# Patient Record
Sex: Female | Born: 1985 | Race: White | Hispanic: Yes | Marital: Married | Smoking: Never smoker
Health system: Southern US, Community
[De-identification: ages and names within clinical notes are randomized; demographics above are authoritative.]

## PROBLEM LIST (undated history)

## (undated) ENCOUNTER — Ambulatory Visit (HOSPITAL_COMMUNITY): Admission: EM | Payer: Self-pay | Source: Home / Self Care

## (undated) ENCOUNTER — Inpatient Hospital Stay (HOSPITAL_COMMUNITY): Payer: Self-pay

## (undated) DIAGNOSIS — O139 Gestational [pregnancy-induced] hypertension without significant proteinuria, unspecified trimester: Secondary | ICD-10-CM

## (undated) DIAGNOSIS — Z789 Other specified health status: Secondary | ICD-10-CM

## (undated) HISTORY — DX: Gestational (pregnancy-induced) hypertension without significant proteinuria, unspecified trimester: O13.9

## (undated) HISTORY — PX: CHOLECYSTECTOMY: SHX55

---

## 2006-03-08 ENCOUNTER — Inpatient Hospital Stay (HOSPITAL_COMMUNITY): Admission: AD | Admit: 2006-03-08 | Discharge: 2006-03-08 | Payer: Self-pay | Admitting: Obstetrics

## 2006-03-09 ENCOUNTER — Inpatient Hospital Stay (HOSPITAL_COMMUNITY): Admission: AD | Admit: 2006-03-09 | Discharge: 2006-03-13 | Payer: Self-pay | Admitting: Obstetrics

## 2006-03-10 DIAGNOSIS — O139 Gestational [pregnancy-induced] hypertension without significant proteinuria, unspecified trimester: Secondary | ICD-10-CM

## 2007-10-28 ENCOUNTER — Ambulatory Visit (HOSPITAL_COMMUNITY): Admission: RE | Admit: 2007-10-28 | Discharge: 2007-10-28 | Payer: Self-pay | Admitting: Physician Assistant

## 2008-01-02 ENCOUNTER — Emergency Department (HOSPITAL_COMMUNITY): Admission: EM | Admit: 2008-01-02 | Discharge: 2008-01-03 | Payer: Self-pay | Admitting: Emergency Medicine

## 2009-05-11 ENCOUNTER — Inpatient Hospital Stay (HOSPITAL_COMMUNITY): Admission: AD | Admit: 2009-05-11 | Discharge: 2009-05-11 | Payer: Self-pay | Admitting: Family Medicine

## 2010-07-22 LAB — URINE CULTURE

## 2010-07-22 LAB — URINE MICROSCOPIC-ADD ON

## 2010-07-22 LAB — URINALYSIS, ROUTINE W REFLEX MICROSCOPIC
Nitrite: NEGATIVE
Protein, ur: NEGATIVE mg/dL
Urobilinogen, UA: 0.2 mg/dL (ref 0.0–1.0)
pH: 6.5 (ref 5.0–8.0)

## 2010-07-22 LAB — GC/CHLAMYDIA PROBE AMP, GENITAL: Chlamydia, DNA Probe: NEGATIVE

## 2010-07-22 LAB — WET PREP, GENITAL: Clue Cells Wet Prep HPF POC: NONE SEEN

## 2012-07-08 LAB — OB RESULTS CONSOLE ABO/RH

## 2012-12-08 LAB — OB RESULTS CONSOLE GC/CHLAMYDIA
Chlamydia: NEGATIVE
Gonorrhea: NEGATIVE

## 2012-12-08 LAB — OB RESULTS CONSOLE ANTIBODY SCREEN: ANTIBODY SCREEN: NEGATIVE

## 2012-12-08 LAB — OB RESULTS CONSOLE RPR: RPR: NONREACTIVE

## 2012-12-08 LAB — OB RESULTS CONSOLE HEPATITIS B SURFACE ANTIGEN: HEP B S AG: NEGATIVE

## 2012-12-08 LAB — OB RESULTS CONSOLE HIV ANTIBODY (ROUTINE TESTING): HIV: NONREACTIVE

## 2012-12-08 LAB — OB RESULTS CONSOLE ABO/RH: RH Type: POSITIVE

## 2012-12-08 LAB — OB RESULTS CONSOLE RUBELLA ANTIBODY, IGM: Rubella: IMMUNE

## 2013-04-10 ENCOUNTER — Inpatient Hospital Stay (HOSPITAL_COMMUNITY): Payer: Medicaid Other

## 2013-04-10 ENCOUNTER — Inpatient Hospital Stay (HOSPITAL_COMMUNITY)
Admission: AD | Admit: 2013-04-10 | Discharge: 2013-04-10 | Disposition: A | Discharge status: Home / Self Care | Payer: Self-pay | Source: Ambulatory Visit | Attending: Obstetrics | Admitting: Obstetrics

## 2013-04-10 ENCOUNTER — Encounter (HOSPITAL_COMMUNITY): Payer: Self-pay

## 2013-04-10 ENCOUNTER — Inpatient Hospital Stay (HOSPITAL_COMMUNITY): Payer: Self-pay

## 2013-04-10 DIAGNOSIS — J9819 Other pulmonary collapse: Secondary | ICD-10-CM | POA: Insufficient documentation

## 2013-04-10 DIAGNOSIS — R0989 Other specified symptoms and signs involving the circulatory and respiratory systems: Secondary | ICD-10-CM | POA: Insufficient documentation

## 2013-04-10 DIAGNOSIS — R0609 Other forms of dyspnea: Secondary | ICD-10-CM | POA: Insufficient documentation

## 2013-04-10 DIAGNOSIS — O99891 Other specified diseases and conditions complicating pregnancy: Secondary | ICD-10-CM | POA: Insufficient documentation

## 2013-04-10 DIAGNOSIS — R079 Chest pain, unspecified: Secondary | ICD-10-CM | POA: Insufficient documentation

## 2013-04-10 DIAGNOSIS — O368190 Decreased fetal movements, unspecified trimester, not applicable or unspecified: Secondary | ICD-10-CM | POA: Insufficient documentation

## 2013-04-10 DIAGNOSIS — R059 Cough, unspecified: Secondary | ICD-10-CM | POA: Insufficient documentation

## 2013-04-10 DIAGNOSIS — R05 Cough: Secondary | ICD-10-CM | POA: Insufficient documentation

## 2013-04-10 HISTORY — DX: Other specified health status: Z78.9

## 2013-04-10 LAB — CBC
HCT: 25.5 % — ABNORMAL LOW (ref 36.0–46.0)
MCHC: 30.6 g/dL (ref 30.0–36.0)
MCV: 65.6 fL — ABNORMAL LOW (ref 78.0–100.0)
Platelets: 325 10*3/uL (ref 150–400)
RBC: 3.89 MIL/uL (ref 3.87–5.11)

## 2013-04-10 LAB — COMPREHENSIVE METABOLIC PANEL
ALT: 6 U/L (ref 0–35)
Alkaline Phosphatase: 78 U/L (ref 39–117)
Chloride: 104 mEq/L (ref 96–112)
GFR calc Af Amer: >90 mL/min (ref >90)
GFR calc non Af Amer: >90 mL/min (ref >90)
Potassium: 3.5 mEq/L (ref 3.5–5.1)
Total Bilirubin: 0.2 mg/dL — ABNORMAL LOW (ref 0.3–1.2)
Total Protein: 6.5 g/dL (ref 6.0–8.3)

## 2013-04-10 LAB — TROPONIN I: Troponin I: <0.3 ng/mL (ref <0.30)

## 2013-04-10 MED ORDER — IOHEXOL 350 MG/ML SOLN
100.0000 mL | Freq: Once | INTRAVENOUS | Status: AC | PRN
  Administered 2013-04-10: 100 mL via INTRAVENOUS

## 2013-04-10 MED ORDER — FAMOTIDINE 20 MG PO TABS: 20.0000 mg | ORAL_TABLET | Freq: Two times a day (BID) | ORAL | Status: DC

## 2013-04-10 MED ORDER — SODIUM CHLORIDE 0.9 % IV BOLUS (SEPSIS)
1000.0000 mL | Freq: Once | INTRAVENOUS | Status: AC
  Administered 2013-04-10: 1000 mL via INTRAVENOUS

## 2013-04-10 MED ORDER — GI COCKTAIL ~~LOC~~
30.0000 mL | ORAL | Status: AC
  Administered 2013-04-10: 30 mL via ORAL
  Filled 2013-04-10: qty 30

## 2013-04-10 MED ORDER — LACTATED RINGERS IV BOLUS (SEPSIS): 1000.0000 mL | Freq: Once | INTRAVENOUS | Status: DC

## 2013-04-10 NOTE — MAU Provider Note (Signed)
Chief Complaint:  Chest Pain   First Provider Initiated Contact with Patient 04/10/13 2004      HPI: Holly Gutierrez is a 27 y.o. G2P1001 at [redacted]w[redacted]d who presents to maternity admissions reporting chest pain described as squeezing pain radiating into her left shoulder and left upper back starting this afternoon and gradually worsening.  She reports pain with deep breaths and is tearful and sitting upright upon arrival in MAU.  She reports eating 40 minutes before onset of symptoms, denies taking medication today.  She denies cough, respiratory symptoms prior to onset of pain today, exposure to illness at work/school/home, or history of anxiety. She denies recent pain in her legs or abdomen or hx of blood clots.  She reports good fetal movement, denies LOF, vaginal bleeding, vaginal itching/burning, urinary symptoms, h/a, dizziness, n/v, or fever/chills.     Past Medical History: Past Medical History  Diagnosis Date  . Medical history non-contributory     Past obstetric history: OB History  Gravida Para Term Preterm AB SAB TAB Ectopic Multiple Living  2 1 1       1     # Outcome Date GA Lbr Len/2nd Weight Sex Delivery Anes PTL Lv  2 CUR           1 TRM      SVD   Y      Past Surgical History: Past Surgical History  Procedure Laterality Date  . Cholecystectomy      Family History: No family history on file.  Social History: History  Substance Use Topics  . Smoking status: Not on file  . Smokeless tobacco: Not on file  . Alcohol Use: Not on file    Allergies: No Known Allergies  Meds:  Prescriptions prior to admission  Medication Sig Dispense Refill  . Prenatal Vit-Fe Fumarate-FA (PRENATAL MULTIVITAMIN) TABS tablet Take 1 tablet by mouth daily at 12 noon.        ROS: Pertinent findings in history of present illness.  Physical Exam  Blood pressure 114/75, pulse 84, temperature 98.9 F (37.2 C), temperature source Oral, resp. rate 28, height 5\' 6"  (1.676 m), weight  71.668 kg (158 lb), SpO2 100.00%. Physical Examination: General appearance - oriented to person, place, and time and in mild to moderate distress Chest - Lung sounds clear and equal bilaterally, diminished in lower lobes with tachypnea Heart - mild tachycardia with HR ~100 during exam, otherwise normal rhythm, heart sounds Abdomen - soft, nontender, nondistended, no masses or organomegaly Neurological - alert, oriented, normal speech, no focal findings or movement disorder noted Skin - normal coloration and turgor, no rashes, no pallor     FHT:  Baseline 150, moderate variability, accelerations present, no decelerations Contractions: None to palpation   Labs: Results for orders placed during the hospital encounter of 04/10/13 (from the past 24 hour(s))  CBC     Status: Abnormal   Collection Time    04/10/13  7:56 PM      Result Value Range   WBC 9.5  4.0 - 10.5 K/uL   RBC 3.89  3.87 - 5.11 MIL/uL   Hemoglobin 7.8 (*) 12.0 - 15.0 g/dL   HCT 16.1 (*) 09.6 - 04.5 %   MCV 65.6 (*) 78.0 - 100.0 fL   MCH 20.1 (*) 26.0 - 34.0 pg   MCHC 30.6  30.0 - 36.0 g/dL   RDW 40.9 (*) 81.1 - 91.4 %   Platelets 325  150 - 400 K/uL  COMPREHENSIVE METABOLIC  PANEL     Status: Abnormal   Collection Time    04/10/13  7:56 PM      Result Value Range   Sodium 135  135 - 145 mEq/L   Potassium 3.5  3.5 - 5.1 mEq/L   Chloride 104  96 - 112 mEq/L   CO2 20  19 - 32 mEq/L   Glucose, Bld 80  70 - 99 mg/dL   BUN 7  6 - 23 mg/dL   Creatinine, Ser 1.61  0.50 - 1.10 mg/dL   Calcium 8.4  8.4 - 09.6 mg/dL   Total Protein 6.5  6.0 - 8.3 g/dL   Albumin 2.9 (*) 3.5 - 5.2 g/dL   AST 14  0 - 37 U/L   ALT 6  0 - 35 U/L   Alkaline Phosphatase 78  39 - 117 U/L   Total Bilirubin 0.2 (*) 0.3 - 1.2 mg/dL   GFR calc non Af Amer >90  >90 mL/min   GFR calc Af Amer >90  >90 mL/min  CK TOTAL AND CKMB     Status: None   Collection Time    04/10/13  8:15 PM      Result Value Range   Total CK 22  7 - 177 U/L   CK, MB 0.8   0.3 - 4.0 ng/mL   Relative Index RELATIVE INDEX IS INVALID  0.0 - 2.5  TROPONIN I     Status: None   Collection Time    04/10/13  8:15 PM      Result Value Range   Troponin I <0.30  <0.30 ng/mL   Imaging: CLINICAL DATA: Chest pain, radiating into left shoulder and left  upper back. Pain with deep breaths.  EXAM:  CT ANGIOGRAPHY CHEST WITH CONTRAST  TECHNIQUE:  Multidetector CT imaging of the chest was performed using the  standard protocol during bolus administration of intravenous  contrast. Multiplanar CT image reconstructions including MIPs were  obtained to evaluate the vascular anatomy.  CONTRAST: OMNIPAQUE IOHEXOL 350 MG/ML SOLN  COMPARISON: Chest radiograph performed earlier today at 8:45 p.m.  FINDINGS:  There is no evidence of pulmonary embolus.  Minimal bilateral atelectasis is noted. The lungs are otherwise  clear. There is no evidence of significant focal consolidation,  pleural effusion or pneumothorax. No masses are identified; no  abnormal focal contrast enhancement is seen.  The mediastinum is unremarkable in appearance. No mediastinal  lymphadenopathy is seen. No pericardial effusion is identified. The  great vessels are grossly unremarkable in appearance. No axillary  lymphadenopathy is seen. A tiny hypodensity within the left thyroid  lobe is likely benign. The visualized portions of the thyroid gland  are otherwise unremarkable.  The visualized portions of the liver and spleen are unremarkable.  No acute osseous abnormalities are seen.  Review of the MIP images confirms the above findings.  IMPRESSION:  1. No evidence of pulmonary embolus.  2. Minimal bilateral atelectasis noted; lungs otherwise clear.    Plan: Consult Dr Gaynell Face O2 via 2L Anderson applied, CBC, CMP, cardiac enzymes, spiral CT, EKG RN called to report worsening of pt SOB, O2 sats remain 100% but pt leaning forward reporting difficulty taking deep enough breath--Portable CXR  stat  Report to Mindi Junker, NP at 2100   2305  Discussed results with client - essentially normal results.  Currently client is having no pain.  Pain has completely resolved.  Discussed at length the importance of taking iron supplements to increase HGB before delivery.  Reviewed medication that would be prescribed for her and to take daily so she does not have heartburn.  Encouraged her to rest in bed tonight.  Vital signs are all normal at this time.  Client is in no distress.  Reviewed plan of care with interpreter present for client.    Assessment Chest pain - unknown cause -possible anxiety or heartburn  Plan Keep your appointments in the office. Rx Pepcid 20 mg PO BID (#60) 1 refill Avoid milk or spicy foods within 4 hours of lying down. Take an iron supplement by the package directions. Be seen in the office this week if your symptoms return.  Sharen Counter Certified Nurse-Midwife 04/10/2013 8:13 PM

## 2013-04-10 NOTE — MAU Note (Signed)
2nd episode of chest pain today. Feels like tightening in upper left of chest and trouble taking deep breaths. Denies cough or being around anyone sick. Has happened once before in the last month. Denies asthma. Denies ctx/LOF/vb. Decreased fetal movement tonight.

## 2013-05-06 NOTE — L&D Delivery Note (Signed)
Delivery Note At 10:04 AM a viable female was delivered via  (Presentation: ;  ).  APGAR: , ; weight .   Placenta status: , .  Cord:  with the following complications: .  Cord pH: not done  Anesthesia:   Episiotomy:  Lacerations:  Suture Repair: 2.0 vicryl Est. Blood Loss (mL):   Mom to postpartum.  Baby to Couplet care / Skin to Skin.  Danyelle Brookover A 07/28/2013, 10:14 AM

## 2013-06-29 LAB — OB RESULTS CONSOLE GBS: STREP GROUP B AG: NEGATIVE

## 2013-07-27 ENCOUNTER — Inpatient Hospital Stay (HOSPITAL_COMMUNITY)
Admission: AD | Admit: 2013-07-27 | Discharge: 2013-07-30 | DRG: 774 | Disposition: A | Discharge status: Home / Self Care | Payer: Medicaid Other | Source: Ambulatory Visit | Attending: Obstetrics | Admitting: Obstetrics

## 2013-07-27 ENCOUNTER — Encounter (HOSPITAL_COMMUNITY): Payer: Self-pay | Admitting: *Deleted

## 2013-07-27 DIAGNOSIS — O9989 Other specified diseases and conditions complicating pregnancy, childbirth and the puerperium: Principal | ICD-10-CM

## 2013-07-27 DIAGNOSIS — O1002 Pre-existing essential hypertension complicating childbirth: Secondary | ICD-10-CM | POA: Diagnosis present

## 2013-07-27 DIAGNOSIS — O99892 Other specified diseases and conditions complicating childbirth: Principal | ICD-10-CM | POA: Diagnosis present

## 2013-07-27 LAB — URINALYSIS, DIPSTICK ONLY
Bilirubin Urine: NEGATIVE
GLUCOSE, UA: NEGATIVE mg/dL
Ketones, ur: NEGATIVE mg/dL
Nitrite: NEGATIVE
PH: 7 (ref 5.0–8.0)
Protein, ur: NEGATIVE mg/dL
Specific Gravity, Urine: <1.005 — ABNORMAL LOW (ref 1.005–1.030)
Urobilinogen, UA: 0.2 mg/dL (ref 0.0–1.0)

## 2013-07-27 LAB — COMPREHENSIVE METABOLIC PANEL
ALBUMIN: 2.6 g/dL — AB (ref 3.5–5.2)
ALT: 7 U/L (ref 0–35)
AST: 17 U/L (ref 0–37)
Alkaline Phosphatase: 228 U/L — ABNORMAL HIGH (ref 39–117)
BUN: 10 mg/dL (ref 6–23)
CO2: 19 mEq/L (ref 19–32)
CREATININE: 0.59 mg/dL (ref 0.50–1.10)
Calcium: 8.6 mg/dL (ref 8.4–10.5)
Chloride: 103 mEq/L (ref 96–112)
GFR calc Af Amer: >90 mL/min (ref >90)
GFR calc non Af Amer: >90 mL/min (ref >90)
Glucose, Bld: 85 mg/dL (ref 70–99)
POTASSIUM: 4.1 meq/L (ref 3.7–5.3)
Sodium: 135 mEq/L — ABNORMAL LOW (ref 137–147)
TOTAL PROTEIN: 6.2 g/dL (ref 6.0–8.3)
Total Bilirubin: 0.3 mg/dL (ref 0.3–1.2)

## 2013-07-27 LAB — CBC
HCT: 30.6 % — ABNORMAL LOW (ref 36.0–46.0)
Hemoglobin: 9.3 g/dL — ABNORMAL LOW (ref 12.0–15.0)
MCH: 21 pg — ABNORMAL LOW (ref 26.0–34.0)
MCHC: 30.4 g/dL (ref 30.0–36.0)
MCV: 69.1 fL — ABNORMAL LOW (ref 78.0–100.0)
Platelets: 328 10*3/uL (ref 150–400)
RBC: 4.43 MIL/uL (ref 3.87–5.11)
RDW: 18.8 % — ABNORMAL HIGH (ref 11.5–15.5)
WBC: 8.5 10*3/uL (ref 4.0–10.5)

## 2013-07-27 LAB — LACTATE DEHYDROGENASE: LDH: 190 U/L (ref 94–250)

## 2013-07-27 LAB — OB RESULTS CONSOLE GBS: GBS: NEGATIVE

## 2013-07-27 LAB — URIC ACID: Uric Acid, Serum: 4.7 mg/dL (ref 2.4–7.0)

## 2013-07-27 MED ORDER — BUTORPHANOL TARTRATE 1 MG/ML IJ SOLN
1.0000 mg | INTRAMUSCULAR | Status: DC | PRN
  Administered 2013-07-28 (×2): 1 mg via INTRAVENOUS
  Filled 2013-07-27 (×2): qty 1

## 2013-07-27 MED ORDER — OXYTOCIN 40 UNITS IN LACTATED RINGERS INFUSION - SIMPLE MED
1.0000 m[IU]/min | INTRAVENOUS | Status: DC
  Administered 2013-07-28: 2 m[IU]/min via INTRAVENOUS
  Filled 2013-07-27: qty 1000

## 2013-07-27 MED ORDER — ONDANSETRON HCL 4 MG/2ML IJ SOLN: 4.0000 mg | Freq: Four times a day (QID) | INTRAMUSCULAR | Status: DC | PRN

## 2013-07-27 MED ORDER — LACTATED RINGERS IV SOLN: 500.0000 mL | INTRAVENOUS | Status: DC | PRN

## 2013-07-27 MED ORDER — CITRIC ACID-SODIUM CITRATE 334-500 MG/5ML PO SOLN: 30.0000 mL | ORAL | Status: DC | PRN

## 2013-07-27 MED ORDER — TERBUTALINE SULFATE 1 MG/ML IJ SOLN: 0.2500 mg | Freq: Once | INTRAMUSCULAR | Status: AC | PRN

## 2013-07-27 MED ORDER — LACTATED RINGERS IV SOLN
INTRAVENOUS | Status: DC
  Administered 2013-07-27: 100 mL/h via INTRAVENOUS
  Administered 2013-07-27 – 2013-07-28 (×2): via INTRAVENOUS

## 2013-07-27 MED ORDER — MAGNESIUM SULFATE 40 G IN LACTATED RINGERS - SIMPLE
2.0000 g/h | INTRAVENOUS | Status: DC
Start: 2013-07-27 — End: 2013-07-28
  Administered 2013-07-27 (×2): 2 g/h via INTRAVENOUS
  Filled 2013-07-27: qty 500

## 2013-07-27 MED ORDER — OXYTOCIN BOLUS FROM INFUSION: 500.0000 mL | INTRAVENOUS | Status: DC

## 2013-07-27 MED ORDER — OXYCODONE-ACETAMINOPHEN 5-325 MG PO TABS: 1.0000 | ORAL_TABLET | ORAL | Status: DC | PRN

## 2013-07-27 MED ORDER — MISOPROSTOL 25 MCG QUARTER TABLET
25.0000 ug | ORAL_TABLET | Freq: Once | ORAL | Status: AC
  Administered 2013-07-27: 25 ug via VAGINAL
  Filled 2013-07-27: qty 0.25

## 2013-07-27 MED ORDER — MAGNESIUM SULFATE BOLUS VIA INFUSION
4.0000 g | Freq: Once | INTRAVENOUS | Status: AC
  Administered 2013-07-27: 4 g via INTRAVENOUS
  Filled 2013-07-27: qty 500

## 2013-07-27 MED ORDER — OXYTOCIN 40 UNITS IN LACTATED RINGERS INFUSION - SIMPLE MED: 62.5000 mL/h | INTRAVENOUS | Status: DC

## 2013-07-27 MED ORDER — FLEET ENEMA 7-19 GM/118ML RE ENEM: 1.0000 | ENEMA | RECTAL | Status: DC | PRN

## 2013-07-27 MED ORDER — OXYTOCIN 40 UNITS IN LACTATED RINGERS INFUSION - SIMPLE MED: 1.0000 m[IU]/min | INTRAVENOUS | Status: DC

## 2013-07-27 MED ORDER — ACETAMINOPHEN 325 MG PO TABS: 650.0000 mg | ORAL_TABLET | ORAL | Status: DC | PRN

## 2013-07-27 MED ORDER — IBUPROFEN 600 MG PO TABS: 600.0000 mg | ORAL_TABLET | Freq: Four times a day (QID) | ORAL | Status: DC | PRN

## 2013-07-27 MED ORDER — LIDOCAINE HCL (PF) 1 % IJ SOLN
30.0000 mL | INTRAMUSCULAR | Status: DC | PRN
  Filled 2013-07-27: qty 30

## 2013-07-28 ENCOUNTER — Inpatient Hospital Stay (HOSPITAL_COMMUNITY): Payer: Medicaid Other | Admitting: Anesthesiology

## 2013-07-28 ENCOUNTER — Encounter (HOSPITAL_COMMUNITY): Payer: Medicaid Other | Admitting: Anesthesiology

## 2013-07-28 ENCOUNTER — Encounter (HOSPITAL_COMMUNITY): Payer: Self-pay | Admitting: *Deleted

## 2013-07-28 LAB — TYPE AND SCREEN
ABO/RH(D): O POS
Antibody Screen: NEGATIVE

## 2013-07-28 LAB — CBC
HEMATOCRIT: 31.4 % — AB (ref 36.0–46.0)
HEMATOCRIT: 30.1 % — AB (ref 36.0–46.0)
Hemoglobin: 9.4 g/dL — ABNORMAL LOW (ref 12.0–15.0)
Hemoglobin: 9.1 g/dL — ABNORMAL LOW (ref 12.0–15.0)
MCH: 20.7 pg — ABNORMAL LOW (ref 26.0–34.0)
MCH: 21 pg — ABNORMAL LOW (ref 26.0–34.0)
MCHC: 29.9 g/dL — ABNORMAL LOW (ref 30.0–36.0)
MCHC: 30.2 g/dL (ref 30.0–36.0)
MCV: 69.2 fL — ABNORMAL LOW (ref 78.0–100.0)
MCV: 69.5 fL — ABNORMAL LOW (ref 78.0–100.0)
Platelets: 334 10*3/uL (ref 150–400)
Platelets: 340 10*3/uL (ref 150–400)
RBC: 4.54 MIL/uL (ref 3.87–5.11)
RBC: 4.33 MIL/uL (ref 3.87–5.11)
RDW: 18.9 % — AB (ref 11.5–15.5)
RDW: 18.8 % — ABNORMAL HIGH (ref 11.5–15.5)
WBC: 13.3 10*3/uL — AB (ref 4.0–10.5)
WBC: 17.4 10*3/uL — ABNORMAL HIGH (ref 4.0–10.5)

## 2013-07-28 LAB — MRSA PCR SCREENING: MRSA BY PCR: NEGATIVE

## 2013-07-28 LAB — RPR: RPR Ser Ql: NONREACTIVE

## 2013-07-28 LAB — ABO/RH: ABO/RH(D): O POS

## 2013-07-28 MED ORDER — OXYCODONE-ACETAMINOPHEN 5-325 MG PO TABS
1.0000 | ORAL_TABLET | ORAL | Status: DC | PRN
  Administered 2013-07-28 – 2013-07-29 (×2): 1 via ORAL
  Filled 2013-07-28 (×2): qty 1

## 2013-07-28 MED ORDER — PHENYLEPHRINE 40 MCG/ML (10ML) SYRINGE FOR IV PUSH (FOR BLOOD PRESSURE SUPPORT): 80.0000 ug | PREFILLED_SYRINGE | INTRAVENOUS | Status: DC | PRN

## 2013-07-28 MED ORDER — DIPHENHYDRAMINE HCL 50 MG/ML IJ SOLN: 12.5000 mg | INTRAMUSCULAR | Status: DC | PRN

## 2013-07-28 MED ORDER — TETANUS-DIPHTH-ACELL PERTUSSIS 5-2.5-18.5 LF-MCG/0.5 IM SUSP
0.5000 mL | Freq: Once | INTRAMUSCULAR | Status: AC
  Administered 2013-07-29: 0.5 mL via INTRAMUSCULAR
  Filled 2013-07-28: qty 0.5

## 2013-07-28 MED ORDER — FENTANYL 2.5 MCG/ML BUPIVACAINE 1/10 % EPIDURAL INFUSION (WH - ANES)
14.0000 mL/h | INTRAMUSCULAR | Status: DC | PRN
  Administered 2013-07-28: 14 mL/h via EPIDURAL
  Filled 2013-07-28: qty 125

## 2013-07-28 MED ORDER — WITCH HAZEL-GLYCERIN EX PADS: 1.0000 "application " | MEDICATED_PAD | CUTANEOUS | Status: DC | PRN

## 2013-07-28 MED ORDER — LACTATED RINGERS IV SOLN
INTRAVENOUS | Status: DC
  Administered 2013-07-29: 01:00:00 via INTRAVENOUS

## 2013-07-28 MED ORDER — SIMETHICONE 80 MG PO CHEW
80.0000 mg | CHEWABLE_TABLET | ORAL | Status: DC | PRN
Start: 2013-07-28 — End: 2013-07-30

## 2013-07-28 MED ORDER — MAGNESIUM SULFATE 40 G IN LACTATED RINGERS - SIMPLE
2.0000 g/h | INTRAVENOUS | Status: DC
  Administered 2013-07-28: 2 g/h via INTRAVENOUS
  Filled 2013-07-28 (×2): qty 500

## 2013-07-28 MED ORDER — EPHEDRINE 5 MG/ML INJ
10.0000 mg | INTRAVENOUS | Status: DC | PRN
  Filled 2013-07-28: qty 4

## 2013-07-28 MED ORDER — PHENYLEPHRINE 40 MCG/ML (10ML) SYRINGE FOR IV PUSH (FOR BLOOD PRESSURE SUPPORT)
80.0000 ug | PREFILLED_SYRINGE | INTRAVENOUS | Status: DC | PRN
  Filled 2013-07-28: qty 10

## 2013-07-28 MED ORDER — DIBUCAINE 1 % RE OINT
1.0000 "application " | TOPICAL_OINTMENT | RECTAL | Status: DC | PRN
  Filled 2013-07-28: qty 28

## 2013-07-28 MED ORDER — FERROUS SULFATE 325 (65 FE) MG PO TABS
325.0000 mg | ORAL_TABLET | Freq: Two times a day (BID) | ORAL | Status: DC
  Administered 2013-07-28 – 2013-07-30 (×4): 325 mg via ORAL
  Filled 2013-07-28 (×4): qty 1

## 2013-07-28 MED ORDER — BENZOCAINE-MENTHOL 20-0.5 % EX AERO
1.0000 "application " | INHALATION_SPRAY | CUTANEOUS | Status: DC | PRN
  Administered 2013-07-28: 1 via TOPICAL
  Filled 2013-07-28 (×2): qty 56

## 2013-07-28 MED ORDER — INFLUENZA VAC SPLIT QUAD 0.5 ML IM SUSP
0.5000 mL | INTRAMUSCULAR | Status: AC
  Administered 2013-07-29: 0.5 mL via INTRAMUSCULAR
  Filled 2013-07-28: qty 0.5

## 2013-07-28 MED ORDER — MAGNESIUM SULFATE 40 G IN LACTATED RINGERS - SIMPLE: 2.0000 g/h | INTRAVENOUS | Status: DC

## 2013-07-28 MED ORDER — IBUPROFEN 600 MG PO TABS
600.0000 mg | ORAL_TABLET | Freq: Four times a day (QID) | ORAL | Status: DC
  Administered 2013-07-28 – 2013-07-30 (×8): 600 mg via ORAL
  Filled 2013-07-28 (×9): qty 1

## 2013-07-28 MED ORDER — ONDANSETRON HCL 4 MG PO TABS: 4.0000 mg | ORAL_TABLET | ORAL | Status: DC | PRN

## 2013-07-28 MED ORDER — EPHEDRINE 5 MG/ML INJ: 10.0000 mg | INTRAVENOUS | Status: DC | PRN

## 2013-07-28 MED ORDER — SENNOSIDES-DOCUSATE SODIUM 8.6-50 MG PO TABS
2.0000 | ORAL_TABLET | ORAL | Status: DC
  Administered 2013-07-29 (×2): 2 via ORAL
  Filled 2013-07-28 (×2): qty 2

## 2013-07-28 MED ORDER — PRENATAL MULTIVITAMIN CH
1.0000 | ORAL_TABLET | Freq: Every day | ORAL | Status: DC
  Administered 2013-07-28 – 2013-07-29 (×2): 1 via ORAL
  Filled 2013-07-28 (×3): qty 1

## 2013-07-28 MED ORDER — LANOLIN HYDROUS EX OINT: TOPICAL_OINTMENT | CUTANEOUS | Status: DC | PRN

## 2013-07-28 MED ORDER — LACTATED RINGERS IV SOLN
500.0000 mL | Freq: Once | INTRAVENOUS | Status: AC
  Administered 2013-07-28: 500 mL via INTRAVENOUS

## 2013-07-28 MED ORDER — ZOLPIDEM TARTRATE 5 MG PO TABS: 5.0000 mg | ORAL_TABLET | Freq: Every evening | ORAL | Status: DC | PRN

## 2013-07-28 MED ORDER — DIPHENHYDRAMINE HCL 25 MG PO CAPS: 25.0000 mg | ORAL_CAPSULE | Freq: Four times a day (QID) | ORAL | Status: DC | PRN

## 2013-07-28 MED ORDER — LIDOCAINE HCL (PF) 1 % IJ SOLN
INTRAMUSCULAR | Status: DC | PRN
  Administered 2013-07-28 (×4): 4 mL

## 2013-07-28 MED ORDER — ONDANSETRON HCL 4 MG/2ML IJ SOLN: 4.0000 mg | INTRAMUSCULAR | Status: DC | PRN

## 2013-07-28 NOTE — H&P (Signed)
This is Dr. Francoise CeoBernard Quantavis Obryant dictating the history and physical on  Holly Gutierrez she's a 28 year old gravida 2 para 1001 at 40 weeks and 4 days EDC 07/24/2013 negative GBS she was seen in the office yesterday with a blood pressure 140/106 and she was admitted for induction her cervix was 1 cm and she was not contracting she was started on magnesium sulfate and Cytotec was placed x1 at midnight last night she was started on Pitocin and she's no 4 cm 100% vertex -2 amniotomy performed the fluids clear and her blood pressures have been normal Past medical history negative Past surgical history negative Social history noncontributory System review negative Physical exam well-developed female in labor HEENT negative Lungs clear to P&A Heart regular rhythm no murmurs no gallops Breasts negative Abdomen term Pelvic as described above Extremities negative

## 2013-07-28 NOTE — Lactation Note (Signed)
This note was copied from the chart of Holly Gutierrez. Lactation Consultation Note Assisted mom with pillow support, positioning, latching and how to maintain latch.  Explained supply and demand and encouraged to breast feed every 2 to 3 hours for Holly Gutierrez to get the colostrum and bring in mature milk for eventual plentiful supply through Spanish interpreter.  Demonstrated how to hand express colostrum to entice baby to latch and mom had lots of colostrum.  Even though this is her second baby, this is her first one to breast feed.  Encouraged mom to call for lactation assistance when needed. Patient Name: Holly Gutierrez Today's Date: 07/28/2013 Reason for consult: Initial assessment   Maternal Data Formula Feeding for Exclusion: No Infant to breast within first hour of birth: Yes Has patient been taught Hand Expression?: Yes Does the patient have breastfeeding experience prior to this delivery?: No  Feeding Feeding Type: Breast Fed Length of feed: 25 min  LATCH Score/Interventions Latch: Repeated attempts needed to sustain latch, nipple held in mouth throughout feeding, stimulation needed to elicit sucking reflex.  Audible Swallowing: A few with stimulation Intervention(s): Skin to skin;Hand expression  Type of Nipple: Everted at rest and after stimulation  Comfort (Breast/Nipple): Soft / non-tender     Hold (Positioning): Assistance needed to correctly position infant at breast and maintain latch. Intervention(s): Breastfeeding basics reviewed;Position options;Skin to skin;Support Pillows  LATCH Score: 7  Lactation Tools Discussed/Used     Consult Status Consult Status: Follow-up Follow-up type: In-patient    Louis MeckelWilliams, Kaijah Abts Kay 07/28/2013, 1:00 PM

## 2013-07-28 NOTE — Anesthesia Procedure Notes (Signed)
Epidural Patient location during procedure: OB Start time: 07/28/2013 7:09 AM  Staffing Performed by: anesthesiologist   Preanesthetic Checklist Completed: patient identified, site marked, surgical consent, pre-op evaluation, timeout performed, IV checked, risks and benefits discussed and monitors and equipment checked  Epidural Patient position: sitting Prep: site prepped and draped and DuraPrep Patient monitoring: continuous pulse ox and blood pressure Approach: midline Injection technique: LOR air  Needle:  Needle type: Tuohy  Needle gauge: 17 G Needle length: 9 cm and 9 Needle insertion depth: 4.5 cm Catheter type: closed end flexible Catheter size: 19 Gauge Catheter at skin depth: 9.5 cm Test dose: negative  Assessment Events: blood not aspirated, injection not painful, no injection resistance, negative IV test and no paresthesia  Additional Notes Discussed risk of headache, infection, bleeding, nerve injury and failed or incomplete block.  Patient voices understanding and wishes to proceed.  Epidural placed easily on first attempt.  nO paresthesia.  Patient tolerated procedure well with no apparent complications.  A.Jaquia Benedicto, MDReason for block:procedure for pain

## 2013-07-28 NOTE — Anesthesia Postprocedure Evaluation (Signed)
Anesthesia Post Note  Patient: Holly JamesMaria G Rivera Gutierrez  Procedure(s) Performed: * No procedures listed *  Anesthesia type: Epidural  Patient location: Mother/Baby  Post pain: Pain level controlled  Post assessment: Post-op Vital signs reviewed  Last Vitals:  Filed Vitals:   07/28/13 1905  BP:   Pulse: 96  Temp:   Resp:     Post vital signs: Reviewed  Level of consciousness:alert  Complications: No apparent anesthesia complications

## 2013-07-28 NOTE — Anesthesia Preprocedure Evaluation (Addendum)
Anesthesia Evaluation  Patient identified by MRN, date of birth, ID band Patient awake    Reviewed: Allergy & Precautions, H&P , NPO status , Patient's Chart, lab work & pertinent test results, reviewed documented beta blocker date and time   History of Anesthesia Complications Negative for: history of anesthetic complications  Airway Mallampati: III TM Distance: >3 FB Neck ROM: full    Dental  (+) Teeth Intact   Pulmonary neg pulmonary ROS,  breath sounds clear to auscultation        Cardiovascular hypertension (on magnesium), Rhythm:regular Rate:Normal     Neuro/Psych negative neurological ROS  negative psych ROS   GI/Hepatic negative GI ROS, Neg liver ROS,   Endo/Other  negative endocrine ROS  Renal/GU negative Renal ROS     Musculoskeletal   Abdominal   Peds  Hematology negative hematology ROS (+)   Anesthesia Other Findings   Reproductive/Obstetrics (+) Pregnancy                          Anesthesia Physical Anesthesia Plan  ASA: II  Anesthesia Plan: Epidural   Post-op Pain Management:    Induction:   Airway Management Planned:   Additional Equipment:   Intra-op Plan:   Post-operative Plan:   Informed Consent: I have reviewed the patients History and Physical, chart, labs and discussed the procedure including the risks, benefits and alternatives for the proposed anesthesia with the patient or authorized representative who has indicated his/her understanding and acceptance.     Plan Discussed with:   Anesthesia Plan Comments:         Anesthesia Quick Evaluation

## 2013-07-29 LAB — CBC
HEMATOCRIT: 20.9 % — AB (ref 36.0–46.0)
Hemoglobin: 6.4 g/dL — CL (ref 12.0–15.0)
MCH: 21.3 pg — AB (ref 26.0–34.0)
MCHC: 30.6 g/dL (ref 30.0–36.0)
MCV: 69.4 fL — AB (ref 78.0–100.0)
Platelets: 293 10*3/uL (ref 150–400)
RBC: 3.01 MIL/uL — ABNORMAL LOW (ref 3.87–5.11)
RDW: 19.2 % — AB (ref 11.5–15.5)
WBC: 13 10*3/uL — ABNORMAL HIGH (ref 4.0–10.5)

## 2013-07-29 NOTE — Progress Notes (Signed)
UR chart review completed.  

## 2013-07-29 NOTE — Progress Notes (Signed)
Patient ID: Holly JamesMaria G Rivera Gutierrez, female   DOB: 1986/02/05, 28 y.o.   MRN: [id number] We'll postpartum day one Blood pressure is normal Fundus firm Lochia moderate DC magnesium sulfate today and transfer and and

## 2013-07-29 NOTE — Progress Notes (Signed)
CRITICAL VALUE ALERT  Critical value received:  Hgb 6.4  Date of notification:  07/29/2013  Time of notification:  0550  Critical value read back: yes  Nurse who received alert:  Stann OreAllycha Lynda Capistran, RN  MD notified (1st page):  Wilburt FinlayB. Marshall, MD  Time of first page:  [phone]  MD notified (2nd page):  Time of second page:  Responding MD:  Wilburt FinlayB. Marshall, MD  Time MD responded:  [phone]

## 2013-07-29 NOTE — Lactation Note (Signed)
This note was copied from the chart of Holly Gutierrez. Lactation Consultation Note    Follow up brief consult with this mo of a term baby, now 3324 hours old. Mom has been breast and formula feeding. I reviewed again with mom how her colostrum was enough for he baby, and that giving formula will decrease her milk supply, by keeping him away from ehr breast. Mom is aware to call for assistance with latching, when the baby wakes to fed next. [facility]   Patient Name: Holly Gutierrez NFAOZ'HToday's Date: 07/29/2013     Maternal Data    Feeding Feeding Type: Bottle Fed - Formula Nipple Type: Slow - flow Length of feed: 10 min  LATCH Score/Interventions                      Lactation Tools Discussed/Used     Consult Status      Holly Gutierrez, Holly Gutierrez 07/29/2013, 10:24 AM

## 2013-07-29 NOTE — Lactation Note (Signed)
This note was copied from the chart of Holly Gutierrez. Lactation Consultation Note:Follow up visit with mom. Spanish interpreter present. She reports that he nurses well on left breast but has trouble on the right. Mom pumping  To erect nipple and mom reports that she feels very heavy. Baby would not latch. Attempted with NS and baby nursed for 5 min. Mom instructed in placing NS onto nipple.Then took the NS off and baby latched without it and nursed for 5 min. Mom pleased that he had done that well.  No questions at present. To call for assist prn  Patient Name: Holly Gutierrez UJWJX'BToday's Date: 07/29/2013 Reason for consult: Follow-up assessment   Maternal Data    Feeding Feeding Type: Breast Fed Length of feed: 10 min  LATCH Score/Interventions Latch: Grasps breast easily, tongue down, lips flanged, rhythmical sucking. (with NS)  Audible Swallowing: A few with stimulation  Type of Nipple: Flat  Comfort (Breast/Nipple): Soft / non-tender     Hold (Positioning): Assistance needed to correctly position infant at breast and maintain latch. Intervention(s): Breastfeeding basics reviewed;Position options  LATCH Score: 7  Lactation Tools Discussed/Used Tools: Nipple Shields Nipple shield size: 24   Consult Status Consult Status: Follow-up Date: 07/30/13 Follow-up type: In-patient    Pamelia HoitWeeks, Trenise Turay D 07/29/2013, 3:56 PM

## 2013-07-30 NOTE — Discharge Instructions (Signed)
Discharge instructions   You can wash your hair  Shower  Eat what you want  Drink what you want  See me in 6 weeks  Your ankles are going to swell more in the next 2 weeks than when pregnant  No sex for 6 weeks   Usama Harkless A, MD 07/30/2013

## 2013-07-30 NOTE — Discharge Summary (Signed)
Obstetric Discharge Summary Reason for Admission: induction of labor Prenatal Procedures: none Intrapartum Procedures: spontaneous vaginal delivery Postpartum Procedures: none Complications-Operative and Postpartum: none Hemoglobin  Date Value Ref Range Status  07/29/2013 6.4* 12.0 - 15.0 g/dL Final     REPEATED TO VERIFY     DELTA CHECK NOTED     CRITICAL RESULT CALLED TO, READ BACK BY AND VERIFIED WITH:     [facility] @ 0551 ON [id number] BY BOSTONC     HCT  Date Value Ref Range Status  07/29/2013 20.9* 36.0 - 46.0 % Final    Physical Exam:  General: alert Lochia: appropriate Uterine Fundus: firm Incision: healing well DVT Evaluation: No evidence of DVT seen on physical exam.  Discharge Diagnoses: Term Pregnancy-delivered  Discharge Information: Date: 07/30/2013 Activity: pelvic rest Diet: routine Medications: Percocet Condition: stable Instructions: refer to practice specific booklet Discharge to: home Follow-up Information   Follow up with Kathreen CosierMARSHALL,Garen Woolbright A, MD.   Specialty:  Obstetrics and Gynecology   Contact information:   [address] [place] [id number] [phone]       Newborn Data: Live born female  Birth Weight: 8 lb 7.8 oz (3850 g) APGAR: 8, 9  Home with mother.  Sandie Swayze A 07/30/2013, 6:55 AM

## 2013-07-30 NOTE — Lactation Note (Signed)
This note was copied from the chart of Holly Gutierrez. Lactation Consultation Note  Patient Name: Holly Gutierrez ZOXWR'UToday's Date: 07/30/2013 Reason for consult: Follow-up assessment Holly Gutierrez, Spanish interpreter present for visit. Mom reports she is breast and bottle feeding. She reports baby is latching well. LC did not see latch as baby just fed and was asleep. Mom reports she is not using the nipple shield any longer and baby is latching to the left breast. Mom feels her milk is starting to come in. Reviewed with Mom the importance of putting baby to breast each feeding if this is what she desires, to encourage milk production, prevent engorgement and protect milk supply. Reviewed supply/demand once milk is in. Advised if she continues to supplement, to BF 1st before giving supplement. Engorgement care reviewed if needed. Mom requests hand pump for home. Manual pump demonstrated to Mom. Advised of OP services and support group. Offered assist with latch with next feeding, Mom declines.   Maternal Data    Feeding Feeding Type: Breast Fed Length of feed: 5 min  LATCH Score/Interventions                      Lactation Tools Discussed/Used Tools: Pump;Flanges Breast pump type: Manual   Consult Status Consult Status: Complete Date: 07/30/13 Follow-up type: In-patient    Alfred LevinsGranger, [name] Ann 07/30/2013, 11:35 AM

## 2014-03-07 ENCOUNTER — Encounter (HOSPITAL_COMMUNITY): Payer: Self-pay | Admitting: *Deleted

## 2018-06-03 ENCOUNTER — Other Ambulatory Visit: Payer: Self-pay

## 2018-06-03 ENCOUNTER — Encounter (HOSPITAL_COMMUNITY): Payer: Self-pay | Admitting: *Deleted

## 2018-06-03 ENCOUNTER — Emergency Department (HOSPITAL_COMMUNITY): Payer: Self-pay

## 2018-06-03 ENCOUNTER — Emergency Department (HOSPITAL_COMMUNITY)
Admission: EM | Admit: 2018-06-03 | Discharge: 2018-06-03 | Disposition: A | Discharge status: Home / Self Care | Payer: Self-pay | Attending: Emergency Medicine | Admitting: Emergency Medicine

## 2018-06-03 DIAGNOSIS — K529 Noninfective gastroenteritis and colitis, unspecified: Secondary | ICD-10-CM | POA: Insufficient documentation

## 2018-06-03 DIAGNOSIS — R1084 Generalized abdominal pain: Secondary | ICD-10-CM

## 2018-06-03 LAB — COMPREHENSIVE METABOLIC PANEL
ALT: 23 U/L (ref 0–44)
ANION GAP: 9 (ref 5–15)
AST: 26 U/L (ref 15–41)
Albumin: 4.1 g/dL (ref 3.5–5.0)
Alkaline Phosphatase: 85 U/L (ref 38–126)
BUN: 5 mg/dL — ABNORMAL LOW (ref 6–20)
CO2: 23 mmol/L (ref 22–32)
Calcium: 9.1 mg/dL (ref 8.9–10.3)
Chloride: 106 mmol/L (ref 98–111)
Creatinine, Ser: 0.57 mg/dL (ref 0.44–1.00)
Glucose, Bld: 102 mg/dL — ABNORMAL HIGH (ref 70–99)
POTASSIUM: 3.1 mmol/L — AB (ref 3.5–5.1)
SODIUM: 138 mmol/L (ref 135–145)
Total Bilirubin: 0.5 mg/dL (ref 0.3–1.2)
Total Protein: 7.2 g/dL (ref 6.5–8.1)

## 2018-06-03 LAB — CBC WITH DIFFERENTIAL/PLATELET
Abs Immature Granulocytes: 0.02 10*3/uL (ref 0.00–0.07)
Basophils Absolute: 0 10*3/uL (ref 0.0–0.1)
Basophils Relative: 0 %
EOS PCT: 0 %
Eosinophils Absolute: 0 10*3/uL (ref 0.0–0.5)
HCT: 30.3 % — ABNORMAL LOW (ref 36.0–46.0)
HEMOGLOBIN: 8.8 g/dL — AB (ref 12.0–15.0)
Immature Granulocytes: 0 %
LYMPHS ABS: 2.3 10*3/uL (ref 0.7–4.0)
LYMPHS PCT: 32 %
MCH: 18.9 pg — ABNORMAL LOW (ref 26.0–34.0)
MCHC: 29 g/dL — ABNORMAL LOW (ref 30.0–36.0)
MCV: 65.2 fL — ABNORMAL LOW (ref 80.0–100.0)
Monocytes Absolute: 0.5 10*3/uL (ref 0.1–1.0)
Monocytes Relative: 8 %
NEUTROS ABS: 4.3 10*3/uL (ref 1.7–7.7)
Neutrophils Relative %: 60 %
PLATELETS: 310 10*3/uL (ref 150–400)
RBC: 4.65 MIL/uL (ref 3.87–5.11)
RDW: 18.3 % — ABNORMAL HIGH (ref 11.5–15.5)
WBC: 7.2 10*3/uL (ref 4.0–10.5)
nRBC: 0 % (ref 0.0–0.2)

## 2018-06-03 LAB — URINALYSIS, ROUTINE W REFLEX MICROSCOPIC
BILIRUBIN URINE: NEGATIVE
Glucose, UA: NEGATIVE mg/dL
Ketones, ur: NEGATIVE mg/dL
Nitrite: NEGATIVE
PROTEIN: NEGATIVE mg/dL
SPECIFIC GRAVITY, URINE: 1.005 (ref 1.005–1.030)
pH: 7 (ref 5.0–8.0)

## 2018-06-03 LAB — LIPASE, BLOOD: Lipase: 22 U/L (ref 11–51)

## 2018-06-03 LAB — I-STAT BETA HCG BLOOD, ED (MC, WL, AP ONLY): I-stat hCG, quantitative: <5 m[IU]/mL (ref <5)

## 2018-06-03 MED ORDER — IOHEXOL 300 MG/ML  SOLN
100.0000 mL | Freq: Once | INTRAMUSCULAR | Status: AC | PRN
  Administered 2018-06-03: 100 mL via INTRAVENOUS

## 2018-06-03 MED ORDER — ONDANSETRON HCL 4 MG/2ML IJ SOLN
4.0000 mg | Freq: Once | INTRAMUSCULAR | Status: AC
  Administered 2018-06-03: 4 mg via INTRAVENOUS
  Filled 2018-06-03: qty 2

## 2018-06-03 MED ORDER — DICYCLOMINE HCL 20 MG PO TABS: 20.0000 mg | ORAL_TABLET | Freq: Two times a day (BID) | ORAL | 0 refills | Status: DC

## 2018-06-03 MED ORDER — SODIUM CHLORIDE 0.9 % IV BOLUS
1000.0000 mL | Freq: Once | INTRAVENOUS | Status: AC
  Administered 2018-06-03: 1000 mL via INTRAVENOUS

## 2018-06-03 MED ORDER — DICYCLOMINE HCL 10 MG PO CAPS: 10.0000 mg | ORAL_CAPSULE | Freq: Once | ORAL | Status: DC

## 2018-06-03 MED ORDER — ONDANSETRON 4 MG PO TBDP: 4.0000 mg | ORAL_TABLET | Freq: Three times a day (TID) | ORAL | 0 refills | Status: DC | PRN

## 2018-06-03 NOTE — ED Provider Notes (Signed)
[facility] EMERGENCY DEPARTMENT Provider Note   CSN: [id number] Arrival date & time: 06/03/18  1457     History   Chief Complaint Chief Complaint  Patient presents with  . Abdominal Pain    HPI Holly Gutierrez is a 33 y.o. female.  The history is provided by the patient. A language interpreter was used.  Abdominal Pain   Holly Gutierrez is a 33 y.o. female who presents to the Emergency Department complaining of abdominal pain. She presents to the emergency department complaining of abdominal pain across her lower abdomen. She reports lower abdominal pain with associated nausea, diarrhea and subjective fever since last night. No vomiting, dysuria, vaginal discharge. She went to the clinic yesterday and received a medication for diarrhea but her symptoms have progressed. She is unsure of the medication but states it was not an antibiotic.  No known sick contacts. No new sexual partners. She is sexually active with her husband. LMP was one week ago. No recent travel. No prior similar symptoms. Past Medical History:  Diagnosis Date  . Medical history non-contributory     Patient Active Problem List   Diagnosis Date Noted  . NVD (normal vaginal delivery) 07/28/2013  . Indication for care in labor or delivery 07/27/2013    Past Surgical History:  Procedure Laterality Date  . CHOLECYSTECTOMY       OB History    Gravida  2   Para  2   Term  2   Preterm      AB      Living  2     SAB      TAB      Ectopic      Multiple      Live Births  2            Home Medications    Prior to Admission medications   Not on File    Family History No family history on file.  Social History Social History   Tobacco Use  . Smoking status: Never Smoker  . Smokeless tobacco: Never Used  Substance Use Topics  . Alcohol use: No  . Drug use: No     Allergies   Patient has no known allergies.   Review of Systems Review of  Systems  Gastrointestinal: Positive for abdominal pain.  All other systems reviewed and are negative.    Physical Exam Updated Vital Signs BP (!) 144/97 (BP Location: Right Arm)   Pulse 78   Temp 97.9 F (36.6 C) (Oral)   Resp 16   Ht 5\' 5"  (1.651 m)   Wt 73.9 kg   LMP 05/26/2018 (Exact Date) Comment: states it was not normal  SpO2 100%   BMI 27.12 kg/m   Physical Exam Vitals signs and nursing note reviewed.  Constitutional:      Appearance: She is well-developed.  HENT:     Head: Normocephalic and atraumatic.  Cardiovascular:     Rate and Rhythm: Normal rate and regular rhythm.     Heart sounds: No murmur.  Pulmonary:     Effort: Pulmonary effort is normal. No respiratory distress.     Breath sounds: Normal breath sounds.  Abdominal:     Palpations: Abdomen is soft.     Tenderness: There is no guarding or rebound.     Comments: Mild lower abdominal pain  Musculoskeletal:        General: No tenderness.  Skin:    General: Skin  is warm and dry.  Neurological:     Mental Status: She is alert and oriented to person, place, and time.  Psychiatric:        Behavior: Behavior normal.      ED Treatments / Results  Labs (all labs ordered are listed, but only abnormal results are displayed) Labs Reviewed  COMPREHENSIVE METABOLIC PANEL - Abnormal; Notable for the following components:      Result Value   Potassium 3.1 (*)    Glucose, Bld 102 (*)    BUN 5 (*)    All other components within normal limits  CBC WITH DIFFERENTIAL/PLATELET - Abnormal; Notable for the following components:   Hemoglobin 8.8 (*)    HCT 30.3 (*)    MCV 65.2 (*)    MCH 18.9 (*)    MCHC 29.0 (*)    RDW 18.3 (*)    All other components within normal limits  URINALYSIS, ROUTINE W REFLEX MICROSCOPIC - Abnormal; Notable for the following components:   APPearance HAZY (*)    Hgb urine dipstick MODERATE (*)    Leukocytes, UA LARGE (*)    Bacteria, UA FEW (*)    All other components within  normal limits  LIPASE, BLOOD  I-STAT BETA HCG BLOOD, ED ([facility], [facility], [facility] ONLY)    EKG None  Radiology No results found.  Procedures Procedures (including critical care time)  Medications Ordered in ED Medications  sodium chloride 0.9 % bolus 1,000 mL (0 mLs Intravenous Stopped 06/03/18 1810)  ondansetron (ZOFRAN) injection 4 mg (4 mg Intravenous Given 06/03/18 1705)     Initial Impression / Assessment and Plan / ED Course  I have reviewed the triage vital signs and the nursing notes.  Pertinent labs & imaging results that were available during my care of the patient were reviewed by me and considered in my medical decision making (see chart for details).     Patient here for evaluation of lower abdominal pain, diarrhea and fever since last night. Tenderness across the lower abdomen, greatest over the right lower quadrant. She does have a history of prior appendectomy. Symptoms were partially improved after IV fluid administration but she does have significant tenderness, plan to obtain CT abdomen to rule out stump appendicitis. Patient care transferred pending CT scan. Final Clinical Impressions(s) / ED Diagnoses   Final diagnoses:  None    ED Discharge Orders    None       Tilden Fossa, MD 06/03/18 1900

## 2018-06-03 NOTE — ED Triage Notes (Signed)
C/o abd. Pain with fever and chills and diarrhea onset yest. Denies vomiting

## 2018-06-03 NOTE — ED Provider Notes (Signed)
Assumed care of patient from Dr. Madilyn Hook at change of shift, briefly, fever (subjective), abdominal pain, diarrhea. TTP on exam RLQ, awaiting CT. No pelvic complaints.  Physical Exam  BP 129/77 (BP Location: Left Arm)   Pulse 61   Temp 97.9 F (36.6 C) (Oral)   Resp 14   Ht 5\' 5"  (1.651 m)   Wt 73.9 kg   LMP 05/26/2018 (Exact Date) Comment: states it was not normal  SpO2 100%   BMI 27.12 kg/m   Physical Exam  ED Course/Procedures     Procedures  MDM  CT report with findings of colitis, normal appendix.  Patient given prescription for Zofran and Bentyl.  Repeat abdominal exam with very minimal tenderness.  Patient is feeling better and is ready for discharge home.  Interpreter was used for discharge planning including describing patient's medications and treatment plan including referral to gastroenterologist.  Patient given strict return to ER precautions including fevers, worsening pain or other concerns.       Jeannie Fend, PA-C 06/03/18 2101    Tilden Fossa, MD 06/08/18 [phone]

## 2018-06-03 NOTE — Discharge Instructions (Addendum)
Haga un seguimiento de Dispensing optician, llame al nmero dado para programar una cita.  Tome Bentyl segn lo prescrito para Chief Technology Officer y Technical sales engineer. Tome Zofran segn sea necesario para las nuseas y los vmitos segn lo prescrito. Regrese a Urgencias para Administrator, arts u otras preocupaciones.  DETENGA el imodium.

## 2018-06-22 ENCOUNTER — Ambulatory Visit: Payer: Self-pay | Admitting: Gastroenterology

## 2019-02-19 ENCOUNTER — Emergency Department (HOSPITAL_COMMUNITY)
Admission: EM | Admit: 2019-02-19 | Discharge: 2019-02-19 | Disposition: A | Discharge status: Home / Self Care | Payer: Self-pay | Attending: Emergency Medicine | Admitting: Emergency Medicine

## 2019-02-19 ENCOUNTER — Encounter (HOSPITAL_COMMUNITY): Payer: Self-pay | Admitting: Emergency Medicine

## 2019-02-19 DIAGNOSIS — Z79899 Other long term (current) drug therapy: Secondary | ICD-10-CM | POA: Insufficient documentation

## 2019-02-19 DIAGNOSIS — R1013 Epigastric pain: Secondary | ICD-10-CM | POA: Insufficient documentation

## 2019-02-19 DIAGNOSIS — R630 Anorexia: Secondary | ICD-10-CM | POA: Insufficient documentation

## 2019-02-19 DIAGNOSIS — R197 Diarrhea, unspecified: Secondary | ICD-10-CM | POA: Insufficient documentation

## 2019-02-19 LAB — URINALYSIS, ROUTINE W REFLEX MICROSCOPIC
Bilirubin Urine: NEGATIVE
Glucose, UA: NEGATIVE mg/dL
Hgb urine dipstick: NEGATIVE
Ketones, ur: NEGATIVE mg/dL
Nitrite: NEGATIVE
Protein, ur: 30 mg/dL — AB
Specific Gravity, Urine: 1.026 (ref 1.005–1.030)
pH: 6 (ref 5.0–8.0)

## 2019-02-19 LAB — CBC
HCT: 30.3 % — ABNORMAL LOW (ref 36.0–46.0)
Hemoglobin: 8.4 g/dL — ABNORMAL LOW (ref 12.0–15.0)
MCH: 18.3 pg — ABNORMAL LOW (ref 26.0–34.0)
MCHC: 27.7 g/dL — ABNORMAL LOW (ref 30.0–36.0)
MCV: 66 fL — ABNORMAL LOW (ref 80.0–100.0)
Platelets: 314 10*3/uL (ref 150–400)
RBC: 4.59 MIL/uL (ref 3.87–5.11)
RDW: 18.3 % — ABNORMAL HIGH (ref 11.5–15.5)
WBC: 6.6 10*3/uL (ref 4.0–10.5)
nRBC: 0 % (ref 0.0–0.2)

## 2019-02-19 LAB — I-STAT BETA HCG BLOOD, ED (MC, WL, AP ONLY): I-stat hCG, quantitative: <5 m[IU]/mL (ref <5)

## 2019-02-19 LAB — COMPREHENSIVE METABOLIC PANEL
ALT: 13 U/L (ref 0–44)
AST: 17 U/L (ref 15–41)
Albumin: 4.2 g/dL (ref 3.5–5.0)
Alkaline Phosphatase: 61 U/L (ref 38–126)
Anion gap: 9 (ref 5–15)
BUN: 11 mg/dL (ref 6–20)
CO2: 22 mmol/L (ref 22–32)
Calcium: 8.8 mg/dL — ABNORMAL LOW (ref 8.9–10.3)
Chloride: 107 mmol/L (ref 98–111)
Creatinine, Ser: 0.62 mg/dL (ref 0.44–1.00)
GFR calc Af Amer: >60 mL/min (ref >60)
GFR calc non Af Amer: >60 mL/min (ref >60)
Glucose, Bld: 82 mg/dL (ref 70–99)
Potassium: 3.4 mmol/L — ABNORMAL LOW (ref 3.5–5.1)
Sodium: 138 mmol/L (ref 135–145)
Total Bilirubin: 0.2 mg/dL — ABNORMAL LOW (ref 0.3–1.2)
Total Protein: 7.1 g/dL (ref 6.5–8.1)

## 2019-02-19 LAB — LIPASE, BLOOD: Lipase: 29 U/L (ref 11–51)

## 2019-02-19 MED ORDER — LIDOCAINE VISCOUS HCL 2 % MT SOLN: 10.0000 mL | OROMUCOSAL | 1 refills | Status: DC | PRN

## 2019-02-19 MED ORDER — SUCRALFATE 1 G PO TABS: 1.0000 g | ORAL_TABLET | Freq: Three times a day (TID) | ORAL | 0 refills | Status: DC

## 2019-02-19 MED ORDER — SODIUM CHLORIDE 0.9% FLUSH
3.0000 mL | Freq: Once | INTRAVENOUS | Status: AC
  Administered 2019-02-19: 20:00:00 3 mL via INTRAVENOUS

## 2019-02-19 MED ORDER — PANTOPRAZOLE SODIUM 20 MG PO TBEC: 20.0000 mg | DELAYED_RELEASE_TABLET | Freq: Every day | ORAL | 1 refills | Status: DC

## 2019-02-19 MED ORDER — LIDOCAINE VISCOUS HCL 2 % MT SOLN
15.0000 mL | Freq: Once | OROMUCOSAL | Status: AC
  Administered 2019-02-19: 15 mL via ORAL
  Filled 2019-02-19: qty 15

## 2019-02-19 MED ORDER — ALUM & MAG HYDROXIDE-SIMETH 200-200-20 MG/5ML PO SUSP
30.0000 mL | Freq: Once | ORAL | Status: AC
  Administered 2019-02-19: 20:00:00 30 mL via ORAL
  Filled 2019-02-19: qty 30

## 2019-02-19 NOTE — ED Provider Notes (Signed)
[facility]   CSN: [id number] Arrival date & time: 02/19/19  1523     History   Chief Complaint Chief Complaint  Patient presents with  . Abdominal Pain    HPI Holly Gutierrez is a 33 y.o. female who presents emergency department chief complaint of epigastric abdominal pain.  There is a language barrier and professional translation services are utilized.  Patient is status post cholecystectomy.  Patient states that she has been having epigastric abdominal pain radiating to her back on and off since January.  She states that it is worse after she eats, better when she does not.  She has not taken any medication for.  Since Saturday, 10 October the patient has had constant severe epigastric pain which radiates to the middle of her back.  She states that she feels no nausea she has not had any vomiting but she has had some loose stools.  She states that the pain is much worse after she eats that she has not been able to eat anything for the past several days has only been taking clear liquid.  She denies a previous history of peptic ulcer disease.  She denies reflux symptoms.  She states that this is not the same as her previous biliary colic symptoms.  She denies urinary symptoms, flank pain, fever or chills.  She has no previous history of peptic ulcer disease.  She has no family history of peptic ulcer disease.  She denies taking lots of NSAIDs and states that she does not drink frequently.         The history is provided by the patient and a friend. The history is limited by a language barrier. A language interpreter was used.  Abdominal Pain Pain location:  Epigastric Pain quality: aching, bloating, fullness and gnawing   Pain radiates to:  Back Pain severity:  Severe Onset quality:  Gradual Duration:  7 days Timing:  Constant Progression:  Worsening Chronicity:  Recurrent Context: not alcohol use, not awakening from  sleep, not diet changes, not eating, not laxative use, not medication withdrawal, not previous surgeries, not recent illness, not recent sexual activity, not recent travel, not retching, not sick contacts and not suspicious food intake   Relieved by:  Nothing Worsened by:  Eating Ineffective treatments:  None tried Associated symptoms: anorexia and diarrhea   Associated symptoms: no belching, no chest pain, no chills, no constipation, no cough, no dysuria, no fatigue, no fever, no flatus, no hematemesis, no hematochezia, no hematuria, no melena, no nausea, no shortness of breath, no sore throat, no vaginal bleeding, no vaginal discharge and no vomiting   Risk factors: no alcohol abuse, no aspirin use, not elderly, has not had multiple surgeries, no NSAID use, not obese, not pregnant and no recent hospitalization     Past Medical History:  Diagnosis Date  . Medical history non-contributory     Patient Active Problem List   Diagnosis Date Noted  . NVD (normal vaginal delivery) 07/28/2013  . Indication for care in labor or delivery 07/27/2013    Past Surgical History:  Procedure Laterality Date  . CHOLECYSTECTOMY       OB History    Gravida  2   Para  2   Term  2   Preterm      AB      Living  2     SAB      TAB      Ectopic  Multiple      Live Births  2            Home Medications    Prior to Admission medications   Medication Sig Start Date End Date Taking? Authorizing Provider  dicyclomine (BENTYL) 20 MG tablet Take 1 tablet (20 mg total) by mouth 2 (two) times daily. 06/03/18   Tacy Learn, PA-C  ondansetron (ZOFRAN ODT) 4 MG disintegrating tablet Take 1 tablet (4 mg total) by mouth every 8 (eight) hours as needed for nausea or vomiting. 06/03/18   Tacy Learn, PA-C    Family History No family history on file.  Social History Social History   Tobacco Use  . Smoking status: Never Smoker  . Smokeless tobacco: Never Used  Substance Use  Topics  . Alcohol use: No  . Drug use: No     Allergies   Patient has no known allergies.   Review of Systems Review of Systems  Constitutional: Negative for chills, fatigue and fever.  HENT: Negative for sore throat.   Respiratory: Negative for cough and shortness of breath.   Cardiovascular: Negative for chest pain.  Gastrointestinal: Positive for abdominal pain, anorexia and diarrhea. Negative for constipation, flatus, hematemesis, hematochezia, melena, nausea and vomiting.  Genitourinary: Negative for dysuria, hematuria, vaginal bleeding and vaginal discharge.     Physical Exam Updated Vital Signs BP 133/84   Pulse 66   Temp 98.4 F (36.9 C) (Oral)   Resp 16   SpO2 100%   Physical Exam Vitals signs and nursing note reviewed.  Constitutional:      General: She is not in acute distress.    Appearance: She is well-developed. She is not diaphoretic.  HENT:     Head: Normocephalic and atraumatic.  Eyes:     General: No scleral icterus.    Conjunctiva/sclera: Conjunctivae normal.  Neck:     Musculoskeletal: Normal range of motion.  Cardiovascular:     Rate and Rhythm: Normal rate and regular rhythm.     Heart sounds: Normal heart sounds. No murmur. No friction rub. No gallop.   Pulmonary:     Effort: Pulmonary effort is normal. No respiratory distress.     Breath sounds: Normal breath sounds.  Abdominal:     General: Bowel sounds are normal. There is no distension.     Palpations: Abdomen is soft. There is no mass.     Tenderness: There is abdominal tenderness in the epigastric area. There is no guarding.  Skin:    General: Skin is warm and dry.  Neurological:     Mental Status: She is alert and oriented to person, place, and time.  Psychiatric:        Behavior: Behavior normal.      ED Treatments / Results  Labs (all labs ordered are listed, but only abnormal results are displayed) Labs Reviewed  COMPREHENSIVE METABOLIC PANEL - Abnormal; Notable for  the following components:      Result Value   Potassium 3.4 (*)    Calcium 8.8 (*)    Total Bilirubin 0.2 (*)    All other components within normal limits  CBC - Abnormal; Notable for the following components:   Hemoglobin 8.4 (*)    HCT 30.3 (*)    MCV 66.0 (*)    MCH 18.3 (*)    MCHC 27.7 (*)    RDW 18.3 (*)    All other components within normal limits  URINALYSIS, ROUTINE W REFLEX MICROSCOPIC - Abnormal; Notable  for the following components:   APPearance HAZY (*)    Protein, ur 30 (*)    Leukocytes,Ua MODERATE (*)    Bacteria, UA FEW (*)    All other components within normal limits  LIPASE, BLOOD  I-STAT BETA HCG BLOOD, ED ([facility], [facility], [facility] ONLY)    EKG None  Radiology No results found.  Procedures Procedures (including critical care time)  Medications Ordered in ED Medications  sodium chloride flush (NS) 0.9 % injection 3 mL (3 mLs Intravenous Given 02/19/19 1955)  alum & mag hydroxide-simeth (MAALOX/MYLANTA) 200-200-20 MG/5ML suspension 30 mL (30 mLs Oral Given 02/19/19 1958)    And  lidocaine (XYLOCAINE) 2 % viscous mouth solution 15 mL (15 mLs Oral Given 02/19/19 1958)     Initial Impression / Assessment and Plan / ED Course  I have reviewed the triage vital signs and the nursing notes.  Pertinent labs & imaging results that were available during my care of the patient were reviewed by me and considered in my medical decision making (see chart for details).  Clinical Course as of Feb 19 6  Fri Feb 19, 2019  2108 Patient has had significant pain relief after GI cocktail.   [AH]    Clinical Course User Index [AH] Arthor Captain, PA-C          Holly Gutierrez is a 33 y.o. female who presents with epigastric abdominal pain The history is gathered by patient and EMR and friend at bedside Vitals:   02/19/19 2145 02/19/19 2200 02/19/19 2215 02/19/19 2230  BP: (!) 126/93 (!) 115/103 114/70 (!) 129/94  Pulse: 72 71 69 68  Resp:      Temp:       TempSrc:      SpO2: 99% 99% 100% 100%  Patient's afebrile, normotensive and hemodynamically stable  I personally reviewed the patient's labs which show urinary contamination without evidence of infection, negative pregnancy test, CMP shows mildly low potassium likely secondary to poor oral intake and vomiting hemoglobin of 8.4 which appears to be the patient's baseline microcytic anemia.    Given the large differential diagnosis for Holly Gutierrez, the decision making in this case is of high complexity.  After evaluating all of the data points in this case, the presentation of Holly Gutierrez is most consistent with gastritis or peptic ulcer disease.  She has a follow-up appointment with gastroenterology on the 26th of this month.  Her presentation is NOT consistent with AAA; Mesenteric Ischemia; Bowel Perforation; Bowel Obstruction; Sigmoid Volvulus; Diverticulitis; Appendicitis; Peritonitis; Cholecystitis, ascending cholangitis or other gallbladder disease; perforated ulcer; significant GI bleeding, splenic rupture/infarction; Hepatic abscess; or other surgical/acute abdomen.  Similarly, this presentation is NOT consistent with fistula; incarcerated hernia; Pancreatitis; Diabetic Ketoacidosis; Kidney Stone; Ischemic colitis; Psoas or other abscess; Methanol poisoning; Heavy metal toxicity; or porphyria.  The case is NOT consistent with Fitz-Hugh-Curtis Syndrome, Ectopic Pregnancy, Placental Abruption, PID, Tubo-ovarian abscess, Ovarian Torsion, or STI. This presentation is also NOT consistent with acute coronary syndrome, MI, pulmonary embolism, dissection, borhaave's, arrythmia, pneumothorax, cardiac tamponade, or other emergent cardiopulmonary condition. Moreover, this presentation is NOT consistent with sepsis, pyelonephritis, urinary infection, pneumonia, or other focal bacterial infection.  Strict return and follow-up precautions have been given by me personally with  translation services.  I also provided detailed written instructions verbalized by nursing staff using the teach back method to the patient/family/caregiver(s).  Data Reviewed/Counseling: I have reviewed the patient's vital signs, nursing notes, and other relevant  tests/information. I had a detailed discussion regarding the historical points, exam findings, and any diagnostic results supporting the discharge diagnosis. I also discussed the need for outpatient follow-up and the need to return to the ED if symptoms worsen or if there are any questions or concerns that arise at home   Final Clinical Impressions(s) / ED Diagnoses   Final diagnoses:  Epigastric pain    ED Discharge Orders    None       Arthor Captain, PA-C 02/20/19 0013    Loren Racer, MD 02/20/19 1621

## 2019-02-19 NOTE — ED Triage Notes (Signed)
Pt arrives to ED with c/c of upper abd that radiates into back worse with eating any food. Pt states this has bene on going for over 6 months worse the last 2 days seems to come and go.

## 2019-02-19 NOTE — Discharge Instructions (Signed)
Get help right away if: You have sudden, sharp, or persistent pain in your abdomen. You have bloody or dark black, tarry stools. You vomit blood or material that looks like coffee grounds. You become light-headed or you feel faint. You become weak. You become sweaty or clammy. 

## 2020-05-06 NOTE — L&D Delivery Note (Signed)
Delivery Note Pt progressed through active labor quickly and soon had urge to push.  After a 30 minute second stage, at 9:58 PM a viable female was delivered via Vaginal, Spontaneous (Presentation:   Occiput Posterior).  APGAR: ,9/9 ; weight 6 lb 12.3 oz (3070 g).  After 1 minute, the cord was clamped and cut. 40 units of pitocin diluted in 1000cc LR was infused rapidly IV.  The placenta separated spontaneously and delivered via CCT and maternal pushing effort.  It was inspected and appears to be intact with a 3 VC. Throughout the repair, the pt had several bouts of uterine atony/clots. She received cytotec PR and 1000mg  TXA IV, and bladder emptied of 300cc urine.  The uterus firmed up and bleeding normalized.  Anesthesia: Local Episiotomy: None Lacerations: 2nd degree Suture Repair: 3.0 vicryl Est. Blood Loss (mL): 581  Mom to postpartum.  Baby to Couplet care / Skin to Skin.  01/11/2021, 10:41 PM

## 2020-05-07 ENCOUNTER — Other Ambulatory Visit: Payer: Self-pay

## 2020-05-07 ENCOUNTER — Emergency Department (HOSPITAL_COMMUNITY): Payer: Self-pay

## 2020-05-07 ENCOUNTER — Emergency Department (HOSPITAL_COMMUNITY)
Admission: EM | Admit: 2020-05-07 | Discharge: 2020-05-08 | Disposition: A | Discharge status: Home / Self Care | Payer: Self-pay | Attending: Emergency Medicine | Admitting: Emergency Medicine

## 2020-05-07 ENCOUNTER — Encounter (HOSPITAL_COMMUNITY): Payer: Self-pay | Admitting: Emergency Medicine

## 2020-05-07 DIAGNOSIS — R0789 Other chest pain: Secondary | ICD-10-CM | POA: Insufficient documentation

## 2020-05-07 DIAGNOSIS — W010XXA Fall on same level from slipping, tripping and stumbling without subsequent striking against object, initial encounter: Secondary | ICD-10-CM | POA: Insufficient documentation

## 2020-05-07 DIAGNOSIS — S161XXA Strain of muscle, fascia and tendon at neck level, initial encounter: Secondary | ICD-10-CM | POA: Insufficient documentation

## 2020-05-07 DIAGNOSIS — M25512 Pain in left shoulder: Secondary | ICD-10-CM | POA: Insufficient documentation

## 2020-05-07 DIAGNOSIS — S39012A Strain of muscle, fascia and tendon of lower back, initial encounter: Secondary | ICD-10-CM | POA: Insufficient documentation

## 2020-05-07 DIAGNOSIS — W19XXXA Unspecified fall, initial encounter: Secondary | ICD-10-CM

## 2020-05-07 LAB — I-STAT BETA HCG BLOOD, ED (MC, WL, AP ONLY): I-stat hCG, quantitative: <5 m[IU]/mL (ref <5)

## 2020-05-07 NOTE — ED Triage Notes (Signed)
Pt slipped and fell on wet porch last night.  Reports neck pain, thoracic back pain, R shoulder pain, and pain across chest with SOB.  C-collar placed at triage.

## 2020-05-08 ENCOUNTER — Emergency Department (HOSPITAL_COMMUNITY): Payer: Self-pay

## 2020-05-08 MED ORDER — HYDROCODONE-ACETAMINOPHEN 5-325 MG PO TABS
1.0000 | ORAL_TABLET | Freq: Once | ORAL | Status: AC
  Administered 2020-05-08: 1 via ORAL
  Filled 2020-05-08: qty 1

## 2020-05-08 MED ORDER — IBUPROFEN 600 MG PO TABS: 600.0000 mg | ORAL_TABLET | Freq: Four times a day (QID) | ORAL | 0 refills | Status: DC | PRN

## 2020-05-08 MED ORDER — IBUPROFEN 400 MG PO TABS
600.0000 mg | ORAL_TABLET | Freq: Once | ORAL | Status: AC
  Administered 2020-05-08: 600 mg via ORAL
  Filled 2020-05-08: qty 1

## 2020-05-08 MED ORDER — HYDROCODONE-ACETAMINOPHEN 5-325 MG PO TABS: 1.0000 | ORAL_TABLET | ORAL | 0 refills | Status: DC | PRN

## 2020-05-08 NOTE — ED Provider Notes (Signed)
[facility] EMERGENCY DEPARTMENT Provider Note   CSN: [id number] Arrival date & time: 05/07/20  1410     History Chief Complaint  Patient presents with  . Fall    Deann Mclaine Adaysha Dubinsky is a 35 y.o. female.  Pt presents to the ED today with neck pain, back pain, and left shoulder pain after a fall on 1/1.  Pt has been in the [facility] since yesterday at 4 pm.  She was placed in a c-collar at arrival.  She denies hitting her head or having a loc.   (Triage note said right shoulder pain.  Pt said she was hurting there yesterday, but it has now moved to her left shoulder)        Past Medical History:  Diagnosis Date  . Medical history non-contributory     Patient Active Problem List   Diagnosis Date Noted  . NVD (normal vaginal delivery) 07/28/2013  . Indication for care in labor or delivery 07/27/2013    Past Surgical History:  Procedure Laterality Date  . CHOLECYSTECTOMY       OB History    Gravida  2   Para  2   Term  2   Preterm      AB      Living  2     SAB      IAB      Ectopic      Multiple      Live Births  2           No family history on file.  Social History   Tobacco Use  . Smoking status: Never Smoker  . Smokeless tobacco: Never Used  Vaping Use  . Vaping Use: Never used  Substance Use Topics  . Alcohol use: No  . Drug use: No    Home Medications Prior to Admission medications   Medication Sig Start Date End Date Taking? Authorizing Provider  HYDROcodone-acetaminophen (NORCO/VICODIN) 5-325 MG tablet Take 1 tablet by mouth every 4 (four) hours as needed. 05/08/20  Yes Jacalyn Lefevre, MD  ibuprofen (ADVIL) 600 MG tablet Take 1 tablet (600 mg total) by mouth every 6 (six) hours as needed. 05/08/20  Yes Jacalyn Lefevre, MD  dicyclomine (BENTYL) 20 MG tablet Take 1 tablet (20 mg total) by mouth 2 (two) times daily. Patient not taking: Reported on 02/19/2019 06/03/18   Jeannie Fend, PA-C  Ferrous Sulfate (IRON PO)  Take 2 tablets by mouth daily.    [provider]  lidocaine (XYLOCAINE) 2 % solution Use as directed 10 mLs in the mouth or throat as needed for mouth pain. 02/19/19   Harris, Abigail, PA-C  ondansetron (ZOFRAN ODT) 4 MG disintegrating tablet Take 1 tablet (4 mg total) by mouth every 8 (eight) hours as needed for nausea or vomiting. Patient not taking: Reported on 02/19/2019 06/03/18   Army Melia A, PA-C  pantoprazole (PROTONIX) 20 MG tablet Take 1 tablet (20 mg total) by mouth daily. 02/19/19   Harris, Abigail, PA-C  sucralfate (CARAFATE) 1 g tablet Take 1 tablet (1 g total) by mouth 4 (four) times daily -  with meals and at bedtime. 02/19/19   Arthor Captain, PA-C    Allergies    Patient has no known allergies.  Review of Systems   Review of Systems  Musculoskeletal: Positive for back pain and neck pain.       Left shoulder pain  All other systems reviewed and are negative.   Physical Exam  Updated Vital Signs BP (!) 146/91 (BP Location: Left Arm)   Pulse (!) 58   Temp 98.2 F (36.8 C) (Oral)   Resp 18   LMP 04/30/2020   SpO2 100%   Physical Exam Vitals and nursing note reviewed.  Constitutional:      Appearance: Normal appearance.  HENT:     Head: Normocephalic and atraumatic.     Right Ear: External ear normal.     Left Ear: External ear normal.     Nose: Nose normal.     Mouth/Throat:     Mouth: Mucous membranes are moist.     Pharynx: Oropharynx is clear.  Eyes:     Extraocular Movements: Extraocular movements intact.     Conjunctiva/sclera: Conjunctivae normal.     Pupils: Pupils are equal, round, and reactive to light.  Neck:     Comments: In c-collar Cardiovascular:     Rate and Rhythm: Normal rate and regular rhythm.     Pulses: Normal pulses.     Heart sounds: Normal heart sounds.  Pulmonary:     Effort: Pulmonary effort is normal.     Breath sounds: Normal breath sounds.  Abdominal:     General: Abdomen is flat. Bowel sounds are normal.      Palpations: Abdomen is soft.  Musculoskeletal:     Cervical back: Spinous process tenderness and muscular tenderness present.       Back:     Comments: Left shoulder pain, but more on the anterior side around her chest.  Good rom in both shoulders.  Skin:    General: Skin is warm.     Capillary Refill: Capillary refill takes less than 2 seconds.  Neurological:     General: No focal deficit present.     Mental Status: She is alert and oriented to person, place, and time.  Psychiatric:        Mood and Affect: Mood normal.        Behavior: Behavior normal.     ED Results / Procedures / Treatments   Labs (all labs ordered are listed, but only abnormal results are displayed) Labs Reviewed  I-STAT BETA HCG BLOOD, ED ([facility], [facility], [facility] ONLY)    EKG EKG Interpretation  Date/Time:  Sunday May 07 2020 15:53:09 EST Ventricular Rate:  66 PR Interval:  114 QRS Duration: 88 QT Interval:  418 QTC Calculation: 438 R Axis:   84 Text Interpretation: Sinus rhythm with Premature supraventricular complexes Otherwise normal ECG No significant change since last tracing Confirmed by Isla Pence [phone]) on 05/08/2020 8:57:59 AM   Radiology DG Chest 2 View  Result Date: 05/07/2020 CLINICAL DATA:  Chest pain short of breath EXAM: CHEST - 2 VIEW COMPARISON:  04/10/2013 FINDINGS: The heart size and mediastinal contours are within normal limits. Both lungs are clear. The visualized skeletal structures are unremarkable. IMPRESSION: No active cardiopulmonary disease. Electronically Signed   By: Donavan Foil M.D.   On: 05/07/2020 16:30   DG Lumbar Spine Complete  Result Date: 05/08/2020 CLINICAL DATA:  Pain following fall EXAM: LUMBAR SPINE - COMPLETE 4+ VIEW COMPARISON:  None. FINDINGS: Frontal, lateral, spot lumbosacral lateral, and bilateral oblique views were obtained. There are 5 non-rib-bearing lumbar type vertebral bodies. There is no fracture or spondylolisthesis. The disc spaces appear normal.  There is no appreciable facet arthropathy. IMPRESSION: No fracture or spondylolisthesis.  No appreciable arthropathy. Electronically Signed   By: Lowella Grip III M.D.   On: 05/08/2020 09:29   DG Shoulder  Right  Result Date: 05/07/2020 CLINICAL DATA:  Fall EXAM: RIGHT SHOULDER - 2+ VIEW COMPARISON:  None. FINDINGS: There is no evidence of fracture or dislocation. There is no evidence of arthropathy or other focal bone abnormality. Soft tissues are unremarkable. Faint ovoid opacity projecting over the right axillary soft tissues and the inferior glenohumeral interval suspected to be due to artifact as it is not confirmed on the additional views of the right shoulder. IMPRESSION: No acute osseous abnormality. Electronically Signed   By: Jasmine Pang M.D.   On: 05/07/2020 16:32   CT Cervical Spine Wo Contrast  Result Date: 05/08/2020 CLINICAL DATA:  Pain following fall EXAM: CT CERVICAL SPINE WITHOUT CONTRAST TECHNIQUE: Multidetector CT imaging of the cervical spine was performed without intravenous contrast. Multiplanar CT image reconstructions were also generated. COMPARISON:  None. FINDINGS: Alignment: There is no appreciable spondylolisthesis. Skull base and vertebrae: Skull base and craniocervical junction regions appear normal. There is no appreciable fracture. No blastic or lytic bone lesions evident. Soft tissues and spinal canal: Prevertebral soft tissues and predental space regions are normal. No evident cord or canal hematoma. No paraspinous lesions are evident. Disc levels: The disc spaces appear unremarkable. No appreciable facet arthropathy. No nerve root edema or effacement. No disc extrusion or stenosis. Upper chest: Visualized upper lung regions are clear. Other: None IMPRESSION: No fracture or spondylolisthesis. No appreciable arthropathy. No nerve root edema or effacement. No disc extrusion or stenosis. Electronically Signed   By: Bretta Bang III M.D.   On: 05/08/2020 08:46   DG  Shoulder Left  Result Date: 05/08/2020 CLINICAL DATA:  Pain following fall EXAM: LEFT SHOULDER - 2+ VIEW COMPARISON:  None. FINDINGS: Frontal, oblique, and Y scapular images were obtained. No fracture or dislocation. Joint spaces appear normal. No erosive change or intra-articular calcification. Visualized left lung clear. IMPRESSION: No appreciable fracture or dislocation. No appreciable arthropathic change. Electronically Signed   By: Bretta Bang III M.D.   On: 05/08/2020 09:30    Procedures Procedures (including critical care time)  Medications Ordered in ED Medications  ibuprofen (ADVIL) tablet 600 mg (600 mg Oral Given 05/08/20 0835)  HYDROcodone-acetaminophen (NORCO/VICODIN) 5-325 MG per tablet 1 tablet (1 tablet Oral Given 05/08/20 9892)    ED Course  I have reviewed the triage vital signs and the nursing notes.  Pertinent labs & imaging results that were available during my care of the patient were reviewed by me and considered in my medical decision making (see chart for details).  Due to language barrier, an interpreter was present during the history-taking and subsequent discussion (and for part of the physical exam) with this patient.   MDM Rules/Calculators/[name]                          No fx noted on xrays and scans.  Pain has improved with treatment.  Pt is advised to return if worse.    Due to language barrier, an interpreter was present during the history-taking and subsequent discussion (and for part of the physical exam) with this patient. Final Clinical Impression(s) / ED Diagnoses Final diagnoses:  Fall, initial encounter  Strain of neck muscle, initial encounter  Strain of lumbar region, initial encounter  Pain, chest wall    Rx / DC Orders ED Discharge Orders         Ordered    HYDROcodone-acetaminophen (NORCO/VICODIN) 5-325 MG tablet  Every 4 hours PRN  05/08/20 0926    ibuprofen (ADVIL) 600 MG tablet  Every 6 hours PRN        05/08/20 0926            Jacalyn Lefevre, MD 05/08/20 [phone]

## 2020-05-08 NOTE — ED Notes (Signed)
Transported to CT 

## 2020-10-26 LAB — OB RESULTS CONSOLE ANTIBODY SCREEN: Antibody Screen: NEGATIVE

## 2020-10-26 LAB — CYTOLOGY - PAP: Pap: NEGATIVE

## 2020-10-26 LAB — OB RESULTS CONSOLE HEPATITIS B SURFACE ANTIGEN: Hepatitis B Surface Ag: NEGATIVE

## 2020-10-26 LAB — OB RESULTS CONSOLE VARICELLA ZOSTER ANTIBODY, IGG: Varicella: NON-IMMUNE/NOT IMMUNE

## 2020-10-26 LAB — OB RESULTS CONSOLE RUBELLA ANTIBODY, IGM: Rubella: IMMUNE

## 2020-10-26 LAB — OB RESULTS CONSOLE HIV ANTIBODY (ROUTINE TESTING): HIV: NONREACTIVE

## 2020-10-26 LAB — OB RESULTS CONSOLE RPR: RPR: NONREACTIVE

## 2020-10-26 LAB — HEPATITIS C ANTIBODY: HCV Ab: NEGATIVE

## 2020-10-26 LAB — GLUCOSE TOLERANCE, 1 HOUR: Glucose, 1 Hour GTT: 123

## 2020-10-26 LAB — OB RESULTS CONSOLE ABO/RH: RH Type: POSITIVE

## 2020-10-26 LAB — SICKLE CELL SCREEN: Sickle Cell Screen: NORMAL

## 2020-10-26 LAB — OB RESULTS CONSOLE GC/CHLAMYDIA
Chlamydia: NEGATIVE
Gonorrhea: NEGATIVE

## 2020-11-04 ENCOUNTER — Inpatient Hospital Stay (HOSPITAL_COMMUNITY)
Admission: AD | Admit: 2020-11-04 | Discharge: 2020-11-04 | Disposition: A | Discharge status: Home / Self Care | Payer: Self-pay | Source: Ambulatory Visit | Attending: Obstetrics & Gynecology | Admitting: Obstetrics & Gynecology

## 2020-11-04 ENCOUNTER — Encounter (HOSPITAL_COMMUNITY): Payer: Self-pay

## 2020-11-04 DIAGNOSIS — Z3A27 27 weeks gestation of pregnancy: Secondary | ICD-10-CM | POA: Insufficient documentation

## 2020-11-04 DIAGNOSIS — O132 Gestational [pregnancy-induced] hypertension without significant proteinuria, second trimester: Secondary | ICD-10-CM | POA: Insufficient documentation

## 2020-11-04 DIAGNOSIS — O09292 Supervision of pregnancy with other poor reproductive or obstetric history, second trimester: Secondary | ICD-10-CM | POA: Insufficient documentation

## 2020-11-04 LAB — COMPREHENSIVE METABOLIC PANEL
ALT: 12 U/L (ref 0–44)
AST: 18 U/L (ref 15–41)
Albumin: 3 g/dL — ABNORMAL LOW (ref 3.5–5.0)
Alkaline Phosphatase: 60 U/L (ref 38–126)
Anion gap: 9 (ref 5–15)
BUN: 5 mg/dL — ABNORMAL LOW (ref 6–20)
CO2: 22 mmol/L (ref 22–32)
Calcium: 8.6 mg/dL — ABNORMAL LOW (ref 8.9–10.3)
Chloride: 105 mmol/L (ref 98–111)
Creatinine, Ser: 0.51 mg/dL (ref 0.44–1.00)
GFR, Estimated: >60 mL/min (ref >60)
Glucose, Bld: 93 mg/dL (ref 70–99)
Potassium: 3 mmol/L — ABNORMAL LOW (ref 3.5–5.1)
Sodium: 136 mmol/L (ref 135–145)
Total Bilirubin: 0.3 mg/dL (ref 0.3–1.2)
Total Protein: 6.1 g/dL — ABNORMAL LOW (ref 6.5–8.1)

## 2020-11-04 LAB — URINALYSIS, ROUTINE W REFLEX MICROSCOPIC
Bilirubin Urine: NEGATIVE
Glucose, UA: NEGATIVE mg/dL
Hgb urine dipstick: NEGATIVE
Ketones, ur: NEGATIVE mg/dL
Nitrite: NEGATIVE
Protein, ur: NEGATIVE mg/dL
Specific Gravity, Urine: 1.003 — ABNORMAL LOW (ref 1.005–1.030)
pH: 7 (ref 5.0–8.0)

## 2020-11-04 LAB — CBC
HCT: 34.2 % — ABNORMAL LOW (ref 36.0–46.0)
Hemoglobin: 10.5 g/dL — ABNORMAL LOW (ref 12.0–15.0)
MCH: 23.3 pg — ABNORMAL LOW (ref 26.0–34.0)
MCHC: 30.7 g/dL (ref 30.0–36.0)
MCV: 76 fL — ABNORMAL LOW (ref 80.0–100.0)
Platelets: 334 10*3/uL (ref 150–400)
RBC: 4.5 MIL/uL (ref 3.87–5.11)
RDW: 21.9 % — ABNORMAL HIGH (ref 11.5–15.5)
WBC: 9.3 10*3/uL (ref 4.0–10.5)
nRBC: 0 % (ref 0.0–0.2)

## 2020-11-04 LAB — PROTEIN / CREATININE RATIO, URINE
Creatinine, Urine: 32.17 mg/dL
Total Protein, Urine: <6 mg/dL

## 2020-11-04 MED ORDER — ACETAMINOPHEN 500 MG PO TABS
1000.0000 mg | ORAL_TABLET | Freq: Once | ORAL | Status: AC
  Administered 2020-11-04: 21:00:00 1000 mg via ORAL
  Filled 2020-11-04: qty 2

## 2020-11-04 MED ORDER — METOCLOPRAMIDE HCL 10 MG PO TABS
10.0000 mg | ORAL_TABLET | Freq: Once | ORAL | Status: AC
  Administered 2020-11-04: 21:00:00 10 mg via ORAL
  Filled 2020-11-04: qty 1

## 2020-11-04 NOTE — MAU Provider Note (Signed)
History     CSN: [id number]  Arrival date and time: 11/04/20 1931   Event Date/Time   First Provider Initiated Contact with Patient 11/04/20 2137      Chief Complaint  Patient presents with   Hypertension   Headache   HPI Ms. Holly Gutierrez is a 35 y.o. G3P2002 at [redacted]w[redacted]d who presents to MAU today with complaint of elevated BP and headache since this morning. She has a history of GHTN with previous pregnancies. She states BP was not elevated this early previously. She had her first prenatal visit at 25 weeks at [facility] and BP was mildly elevated at that time as well. She was told to start BASA and took that today for headache, has not been taking daily. She rates headache at 7/10 now with "burning" in her eyes. She denies abdominal pain, edema, contractions, vaginal bleeding, LOF today. She reports normal fetal movement.   OB History     Gravida  3   Para  2   Term  2   Preterm      AB      Living  2      SAB      IAB      Ectopic      Multiple      Live Births  2           Past Medical History:  Diagnosis Date   Medical history non-contributory     Past Surgical History:  Procedure Laterality Date   CHOLECYSTECTOMY      History reviewed. No pertinent family history.  Social History   Tobacco Use   Smoking status: Never   Smokeless tobacco: Never  Vaping Use   Vaping Use: Never used  Substance Use Topics   Alcohol use: No   Drug use: No    Allergies: No Known Allergies  No medications prior to admission.    Review of Systems  Constitutional:  Negative for fever.  Cardiovascular:  Negative for leg swelling.  Gastrointestinal:  Negative for abdominal pain.  Neurological:  Positive for headaches.  Physical Exam   Blood pressure (!) 142/85, pulse 62, temperature 98 F (36.7 C), resp. rate 16, last menstrual period 04/30/2020, SpO2 100 %, unknown if currently breastfeeding.  Physical Exam Vitals and nursing note reviewed.   Constitutional:      General: She is not in acute distress.    Appearance: She is well-developed.  HENT:     Head: Normocephalic and atraumatic.  Cardiovascular:     Rate and Rhythm: Normal rate.     Heart sounds: Normal heart sounds. No murmur heard. Pulmonary:     Effort: Pulmonary effort is normal. No respiratory distress.     Breath sounds: Normal breath sounds. No wheezing.  Abdominal:     General: There is no distension.     Palpations: Abdomen is soft. There is no mass.     Tenderness: There is no abdominal tenderness. There is no guarding or rebound.  Skin:    General: Skin is warm and dry.     Findings: No erythema.  Neurological:     Mental Status: She is alert and oriented to person, place, and time.     Deep Tendon Reflexes: Reflexes normal.  Psychiatric:        Mood and Affect: Mood normal.     Results for orders placed or performed during the hospital encounter of 11/04/20 (from the past 24 hour(s))  Urinalysis, Routine w reflex  microscopic     Status: Abnormal   Collection Time: 11/04/20  7:52 PM  Result Value Ref Range   Color, Urine STRAW (A) YELLOW   APPearance CLEAR CLEAR   Specific Gravity, Urine 1.003 (L) 1.005 - 1.030   pH 7.0 5.0 - 8.0   Glucose, UA NEGATIVE NEGATIVE mg/dL   Hgb urine dipstick NEGATIVE NEGATIVE   Bilirubin Urine NEGATIVE NEGATIVE   Ketones, ur NEGATIVE NEGATIVE mg/dL   Protein, ur NEGATIVE NEGATIVE mg/dL   Nitrite NEGATIVE NEGATIVE   Leukocytes,Ua MODERATE (A) NEGATIVE   RBC / HPF 0-5 0 - 5 RBC/hpf   WBC, UA 0-5 0 - 5 WBC/hpf   Bacteria, UA MANY (A) NONE SEEN   Squamous Epithelial / LPF 0-5 0 - 5  Protein / creatinine ratio, urine     Status: None   Collection Time: 11/04/20  7:52 PM  Result Value Ref Range   Creatinine, Urine 32.17 mg/dL   Total Protein, Urine <6 mg/dL   Protein Creatinine Ratio        0.00 - 0.15 mg/mg[Cre]  CBC     Status: Abnormal   Collection Time: 11/04/20  9:24 PM  Result Value Ref Range   WBC 9.3  4.0 - 10.5 K/uL   RBC 4.50 3.87 - 5.11 MIL/uL   Hemoglobin 10.5 (L) 12.0 - 15.0 g/dL   HCT 78.2 (L) 95.6 - 21.3 %   MCV 76.0 (L) 80.0 - 100.0 fL   MCH 23.3 (L) 26.0 - 34.0 pg   MCHC 30.7 30.0 - 36.0 g/dL   RDW 08.6 (H) 57.8 - 46.9 %   Platelets 334 150 - 400 K/uL   nRBC 0.0 0.0 - 0.2 %  Comprehensive metabolic panel     Status: Abnormal   Collection Time: 11/04/20  9:24 PM  Result Value Ref Range   Sodium 136 135 - 145 mmol/L   Potassium 3.0 (L) 3.5 - 5.1 mmol/L   Chloride 105 98 - 111 mmol/L   CO2 22 22 - 32 mmol/L   Glucose, Bld 93 70 - 99 mg/dL   BUN 5 (L) 6 - 20 mg/dL   Creatinine, Ser 6.29 0.44 - 1.00 mg/dL   Calcium 8.6 (L) 8.9 - 10.3 mg/dL   Total Protein 6.1 (L) 6.5 - 8.1 g/dL   Albumin 3.0 (L) 3.5 - 5.0 g/dL   AST 18 15 - 41 U/L   ALT 12 0 - 44 U/L   Alkaline Phosphatase 60 38 - 126 U/L   Total Bilirubin 0.3 0.3 - 1.2 mg/dL   GFR, Estimated >52 >84 mL/min   Anion gap 9 5 - 15   Vitals:   11/04/20 2210 11/04/20 2220 11/04/20 2230 11/04/20 2240  BP: (!) 143/84 (!) 135/95 (!) 146/89 (!) 142/85  Pulse: 63 61 60 62  Temp:      Resp:      SpO2:        Fetal Monitoring: Baseline: 120 bpm Variability: moderate Accelerations: 10 x 10 and 15 x 15 Decelerations: none Contractions: none   MAU Course  Procedures None   MDM CBC, CMP, UA Urine culture ordered  Serial BPs  Tylenol and Reglan given. Patient reports resolution of headache symptoms.  Discussed with Dr. Macon Large. Agrees with Dx of GHTN without pre-eclampsia or severe features. Will need to transfer to [facility].   Assessment and Plan  A: SIUP at [redacted]w[redacted]d GHTN   P: Discharge home Recommend Tylenol PRN for pain  Discussed daily BASA use  Pre-eclampsia  and worsening GHTN warning symptoms discussed Patient advised to follow-up with [facility] as scheduled on 11/16/20 for routine high risk prenatal care Patient may return to MAU as needed or if her condition were to change or worsen  Vonzella Nipple,  PA-C 11/04/2020, 10:56 PM

## 2020-11-04 NOTE — MAU Note (Signed)
Patient reports she had an elevated blood pressure in the office this week and it was elevated and was told to watch it. She took it today and it was in the 140's and she has had a headache all day. [facility]  Due date: Oct. 1st Pain score: 7/10

## 2020-11-06 LAB — CULTURE, OB URINE: Culture: NO GROWTH

## 2020-11-15 ENCOUNTER — Encounter: Payer: Self-pay | Admitting: *Deleted

## 2020-11-16 ENCOUNTER — Encounter: Payer: Self-pay | Admitting: Obstetrics and Gynecology

## 2020-11-16 ENCOUNTER — Other Ambulatory Visit: Payer: Self-pay

## 2020-11-16 ENCOUNTER — Ambulatory Visit (INDEPENDENT_AMBULATORY_CARE_PROVIDER_SITE_OTHER): Payer: Self-pay | Admitting: Obstetrics and Gynecology

## 2020-11-16 VITALS — BP 146/97 | HR 75 | Wt 163.7 lb

## 2020-11-16 DIAGNOSIS — Z23 Encounter for immunization: Secondary | ICD-10-CM

## 2020-11-16 DIAGNOSIS — O133 Gestational [pregnancy-induced] hypertension without significant proteinuria, third trimester: Secondary | ICD-10-CM

## 2020-11-16 DIAGNOSIS — Z3A28 28 weeks gestation of pregnancy: Secondary | ICD-10-CM | POA: Insufficient documentation

## 2020-11-16 DIAGNOSIS — O0990 Supervision of high risk pregnancy, unspecified, unspecified trimester: Secondary | ICD-10-CM

## 2020-11-16 DIAGNOSIS — O26893 Other specified pregnancy related conditions, third trimester: Secondary | ICD-10-CM

## 2020-11-16 DIAGNOSIS — O139 Gestational [pregnancy-induced] hypertension without significant proteinuria, unspecified trimester: Secondary | ICD-10-CM | POA: Insufficient documentation

## 2020-11-16 DIAGNOSIS — R519 Headache, unspecified: Secondary | ICD-10-CM

## 2020-11-16 LAB — POCT URINALYSIS DIP (DEVICE)
Bilirubin Urine: NEGATIVE
Glucose, UA: NEGATIVE mg/dL
Hgb urine dipstick: NEGATIVE
Ketones, ur: NEGATIVE mg/dL
Nitrite: NEGATIVE
Protein, ur: NEGATIVE mg/dL
Specific Gravity, Urine: 1.015 (ref 1.005–1.030)
Urobilinogen, UA: 0.2 mg/dL (ref 0.0–1.0)
pH: 6.5 (ref 5.0–8.0)

## 2020-11-16 MED ORDER — MAGNESIUM 400 MG PO TABS
1.0000 | ORAL_TABLET | Freq: Every day | ORAL | 1 refills | Status: DC | PRN
Start: 2020-11-16 — End: 2021-01-13

## 2020-11-16 NOTE — Progress Notes (Signed)
Patient complains of "bad" headache along with "flashing lights" in her vision that started about 2 days ago. Patient stated that it causes her to lose sleep.

## 2020-11-16 NOTE — Progress Notes (Signed)
   PRENATAL VISIT NOTE  Subjective:  Holly Gutierrez is a 35 y.o. G3P2002 at [redacted]w[redacted]d being seen today for ongoing prenatal care.  She is currently monitored for the following issues for this low-risk pregnancy and has Indication for care in labor or delivery; NVD (normal vaginal delivery); Supervision of high risk pregnancy, antepartum; [redacted] weeks gestation of pregnancy; and Gestational hypertension on their problem list.  Patient reports no complaints.  Contractions: Not present. Vag. Bleeding: None.  Movement: Present. Denies leaking of fluid. Pt transferred from [facility] given new diagnosis of gHTN.   The following portions of the patient's history were reviewed and updated as appropriate: allergies, current medications, past family history, past medical history, past social history, past surgical history and problem list.   Objective:   Vitals:   11/16/20 1526  BP: (!) 146/97  Pulse: 75  Weight: 163 lb 11.2 oz (74.3 kg)    Fetal Status: Fetal Heart Rate (bpm): 142   Movement: Present     General:  Alert, oriented and cooperative. Patient is in no acute distress.  Skin: Skin is warm and dry. No rash noted.   Cardiovascular: Normal heart rate noted  Respiratory: Normal respiratory effort, no problems with respiration noted  Abdomen: Soft, gravid, appropriate for gestational age.  Pain/Pressure: Absent     Pelvic: Cervical exam deferred        Extremities: Normal range of motion.  Edema: None  Mental Status: Normal mood and affect. Normal behavior. Normal judgment and thought content.   Assessment and Plan:  Pregnancy: G3P2002 at [redacted]w[redacted]d 1. Supervision of high risk pregnancy, antepartum, [redacted] weeks gestation of pregnancy: Transfer to [facility] given new diagnosis of gHTN. No other complications in current pregnancy. H/o term vaginal delivery x2. No concerns today. Pt with normal FHTs and appropriate fundal height. Mild range blood pressure as noted below. EFW 28% at [redacted]w[redacted]d per [place]  ultrasound. Plan to breastfeed and desires Nexplanon (plan [facility] at [facility] given no current insurance). - plan for Tdap vaccine at [facility] - continue prenatal vitamin + ASA daily - RTC in 2 weeks for f/u prenatal appt; return precautions as noted below  2. Gestational hypertension, third trimester: Blood pressure in mild range today. No concerning signs/symptoms. Pt has blood pressure cuff at home; home measurements reportedly 140s-low 150s/90s. Of note, pt has history of chronic headaches; currently taking tylenol as needed with good relief. - baseline preeclampsia labs collected today - prescribed magnesium 400mg  daily prn for headaches - continue growth every 4 weeks with weekly BPP - provided strict return precautions for signs/symptoms of preeclampsia - plan for delivery before [redacted]wk GA  Preterm labor symptoms and general obstetric precautions including but not limited to vaginal bleeding, contractions, leaking of fluid and fetal movement were reviewed in detail with the patient. Please refer to After Visit Summary for other counseling recommendations.   Return in about 2 weeks (around 11/30/2020) for f/u prenatal appt in person.  Future Appointments  Date Time Provider Department Center  11/21/2020  2:15 PM [facility]  11/30/2020  3:15 PM 12/02/2020, MD [facility]    [facility] MD

## 2020-11-17 LAB — COMPREHENSIVE METABOLIC PANEL
ALT: 9 IU/L (ref 0–32)
AST: 13 IU/L (ref 0–40)
Albumin/Globulin Ratio: 1.7 (ref 1.2–2.2)
Albumin: 4 g/dL (ref 3.8–4.8)
Alkaline Phosphatase: 91 IU/L (ref 44–121)
BUN/Creatinine Ratio: 8 — ABNORMAL LOW (ref 9–23)
BUN: 4 mg/dL — ABNORMAL LOW (ref 6–20)
Bilirubin Total: <0.2 mg/dL (ref 0.0–1.2)
CO2: 18 mmol/L — ABNORMAL LOW (ref 20–29)
Calcium: 8.8 mg/dL (ref 8.7–10.2)
Chloride: 106 mmol/L (ref 96–106)
Creatinine, Ser: 0.5 mg/dL — ABNORMAL LOW (ref 0.57–1.00)
Globulin, Total: 2.4 g/dL (ref 1.5–4.5)
Glucose: 82 mg/dL (ref 65–99)
Potassium: 3.6 mmol/L (ref 3.5–5.2)
Sodium: 142 mmol/L (ref 134–144)
Total Protein: 6.4 g/dL (ref 6.0–8.5)
eGFR: 127 mL/min/{1.73_m2} (ref >59)

## 2020-11-17 LAB — CBC
Hematocrit: 34.6 % (ref 34.0–46.6)
Hemoglobin: 10.8 g/dL — ABNORMAL LOW (ref 11.1–15.9)
MCH: 23.6 pg — ABNORMAL LOW (ref 26.6–33.0)
MCHC: 31.2 g/dL — ABNORMAL LOW (ref 31.5–35.7)
MCV: 76 fL — ABNORMAL LOW (ref 79–97)
Platelets: 327 10*3/uL (ref 150–450)
RBC: 4.58 x10E6/uL (ref 3.77–5.28)
RDW: 20.9 % — ABNORMAL HIGH (ref 11.7–15.4)
WBC: 10.6 10*3/uL (ref 3.4–10.8)

## 2020-11-17 LAB — PROTEIN / CREATININE RATIO, URINE
Creatinine, Urine: 28.3 mg/dL
Protein, Ur: 4.6 mg/dL
Protein/Creat Ratio: 163 mg/g creat (ref 0–200)

## 2020-11-20 ENCOUNTER — Encounter: Payer: Self-pay | Admitting: General Practice

## 2020-11-20 ENCOUNTER — Telehealth: Payer: Self-pay | Admitting: Obstetrics and Gynecology

## 2020-11-20 DIAGNOSIS — O0990 Supervision of high risk pregnancy, unspecified, unspecified trimester: Secondary | ICD-10-CM

## 2020-11-20 DIAGNOSIS — O99013 Anemia complicating pregnancy, third trimester: Secondary | ICD-10-CM

## 2020-11-20 MED ORDER — FERROUS SULFATE 325 (65 FE) MG PO TABS
325.0000 mg | ORAL_TABLET | ORAL | 3 refills | Status: DC
Start: 2020-11-20 — End: 2021-01-13

## 2020-11-20 NOTE — Telephone Encounter (Signed)
Prescription placed for po iron supplement given mild anemia on 28wk labs.  Sheila Oats, MD OB Fellow, [facility] 11/20/2020 6:26 PM

## 2020-11-21 ENCOUNTER — Ambulatory Visit (INDEPENDENT_AMBULATORY_CARE_PROVIDER_SITE_OTHER): Payer: Self-pay

## 2020-11-21 ENCOUNTER — Ambulatory Visit (INDEPENDENT_AMBULATORY_CARE_PROVIDER_SITE_OTHER): Payer: Self-pay | Admitting: General Practice

## 2020-11-21 ENCOUNTER — Other Ambulatory Visit: Payer: Self-pay

## 2020-11-21 VITALS — BP 138/87

## 2020-11-21 DIAGNOSIS — O0990 Supervision of high risk pregnancy, unspecified, unspecified trimester: Secondary | ICD-10-CM

## 2020-11-21 DIAGNOSIS — O133 Gestational [pregnancy-induced] hypertension without significant proteinuria, third trimester: Secondary | ICD-10-CM

## 2020-11-21 NOTE — Progress Notes (Signed)
Pt informed that the ultrasound is considered a limited OB ultrasound and is not intended to be a complete ultrasound exam.  Patient also informed that the ultrasound is not being completed with the intent of assessing for fetal or placental anomalies or any pelvic abnormalities.  Explained that the purpose of today's ultrasound is to assess for  BPP, presentation, and AFI.  Patient acknowledges the purpose of the exam and the limitations of the study.     Initial BP 148/94 with repeat 138/87. Patient denies headaches, dizziness or blurry vision. Patient will follow up at [facility] visit next week.  Chase Caller RN BSN 11/21/20

## 2020-11-22 ENCOUNTER — Telehealth: Payer: Self-pay | Admitting: General Practice

## 2020-11-22 NOTE — Telephone Encounter (Signed)
Called patient with Eda for spanish interpretation and informed her of results as well as Rx at pharmacy. Reviewed Iron supplementation instructions with patient as well. Patient verbalized understanding & had no questions.

## 2020-11-22 NOTE — Telephone Encounter (Signed)
-----   Message from Sheila Oats, MD sent at 11/20/2020  6:27 PM EDT ----- Regarding: Please call pt with lab results--need to start po iron (needs Spanish interpreter) Please call pt regarding recent lab results. Please let her know that preeclampsia labs were normal except for mild anemia. It is important that she take an iron supplement every other day (prescription sent to preferred pharmacy). Thank you!

## 2020-11-28 ENCOUNTER — Other Ambulatory Visit: Payer: Self-pay

## 2020-11-30 ENCOUNTER — Telehealth (INDEPENDENT_AMBULATORY_CARE_PROVIDER_SITE_OTHER): Payer: Self-pay | Admitting: Obstetrics & Gynecology

## 2020-11-30 DIAGNOSIS — O0990 Supervision of high risk pregnancy, unspecified, unspecified trimester: Secondary | ICD-10-CM

## 2020-11-30 DIAGNOSIS — O133 Gestational [pregnancy-induced] hypertension without significant proteinuria, third trimester: Secondary | ICD-10-CM

## 2020-11-30 NOTE — Progress Notes (Signed)
Attempted to call patient x2 on phone number listed as cell phone, [phone] with assistance of interpretor Eda R. Female picked up and disconnected. Home number was attempted and is not in service.   Wynona Canes, CMA

## 2020-12-04 ENCOUNTER — Telehealth: Payer: Self-pay | Admitting: General Practice

## 2020-12-04 NOTE — Telephone Encounter (Signed)
Called patient regarding change in NST/BPP appt from [phone]- no answer, left message regarding appt time change

## 2020-12-05 ENCOUNTER — Ambulatory Visit (INDEPENDENT_AMBULATORY_CARE_PROVIDER_SITE_OTHER): Payer: Self-pay | Admitting: Family Medicine

## 2020-12-05 ENCOUNTER — Ambulatory Visit (INDEPENDENT_AMBULATORY_CARE_PROVIDER_SITE_OTHER): Payer: Self-pay

## 2020-12-05 ENCOUNTER — Other Ambulatory Visit: Payer: Self-pay

## 2020-12-05 VITALS — BP 148/93 | HR 67 | Wt 164.0 lb

## 2020-12-05 DIAGNOSIS — O133 Gestational [pregnancy-induced] hypertension without significant proteinuria, third trimester: Secondary | ICD-10-CM

## 2020-12-05 DIAGNOSIS — O0990 Supervision of high risk pregnancy, unspecified, unspecified trimester: Secondary | ICD-10-CM

## 2020-12-05 NOTE — Progress Notes (Signed)
   Subjective:  Holly Gutierrez is a 35 y.o. G3P2002 at [redacted]w[redacted]d being seen today for ongoing prenatal care.  She is currently monitored for the following issues for this high-risk pregnancy and has Indication for care in labor or delivery; NVD (normal vaginal delivery); Supervision of high risk pregnancy, antepartum; [redacted] weeks gestation of pregnancy; and Gestational hypertension on their problem list.  Patient reports no complaints.  Contractions: Not present. Vag. Bleeding: None.  Movement: Present. Denies leaking of fluid.   The following portions of the patient's history were reviewed and updated as appropriate: allergies, current medications, past family history, past medical history, past social history, past surgical history and problem list. Problem list updated.  Objective:   Vitals:   12/05/20 1120  BP: (!) 148/93  Pulse: 67  Weight: 164 lb (74.4 kg)    Fetal Status: Fetal Heart Rate (bpm): NST   Movement: Present     General:  Alert, oriented and cooperative. Patient is in no acute distress.  Skin: Skin is warm and dry. No rash noted.   Cardiovascular: Normal heart rate noted  Respiratory: Normal respiratory effort, no problems with respiration noted  Abdomen: Soft, gravid, appropriate for gestational age. Pain/Pressure: Absent     Pelvic: Vag. Bleeding: None     Cervical exam deferred        Extremities: Normal range of motion.  Edema: None  Mental Status: Normal mood and affect. Normal behavior. Normal judgment and thought content.   Urinalysis:      Assessment and Plan:  Pregnancy: G3P2002 at [redacted]w[redacted]d  1. Gestational hypertension, third trimester Here for NST/BPP, NST reactive at time of writing and reportedly BPP on the way to 8/8 Asymptomatic today, ongoing mild range BP's Needs anatomy scan with MFM, we will schedule that ASAP Per sonographer baby is subjectively small Reviewed warning signs of PreE - US FETAL BPP W/NONSTRESS; Future - [place] MFM OB DETAIL +14 WK;  Future  2. Supervision of high risk pregnancy, antepartum BP as above FHR normal w reactive NST Otherwise up to date - [place] MFM OB DETAIL +14 WK; Future  Preterm labor symptoms and general obstetric precautions including but not limited to vaginal bleeding, contractions, leaking of fluid and fetal movement were reviewed in detail with the patient. Please refer to After Visit Summary for other counseling recommendations.  Return in about 2 weeks (around 12/19/2020) for [facility] to NST/BPP appt, [facility], ob visit, needs MD.   Venora Maples, MD

## 2020-12-05 NOTE — Progress Notes (Signed)

## 2020-12-13 ENCOUNTER — Ambulatory Visit (INDEPENDENT_AMBULATORY_CARE_PROVIDER_SITE_OTHER): Payer: Self-pay

## 2020-12-13 ENCOUNTER — Other Ambulatory Visit: Payer: Self-pay

## 2020-12-13 ENCOUNTER — Ambulatory Visit: Payer: Self-pay | Admitting: *Deleted

## 2020-12-13 VITALS — BP 135/90 | HR 70 | Wt 168.5 lb

## 2020-12-13 DIAGNOSIS — O133 Gestational [pregnancy-induced] hypertension without significant proteinuria, third trimester: Secondary | ICD-10-CM

## 2020-12-13 NOTE — Progress Notes (Signed)
Pt informed that the ultrasound is considered a limited OB ultrasound and is not intended to be a complete ultrasound exam.  Patient also informed that the ultrasound is not being completed with the intent of assessing for fetal or placental anomalies or any pelvic abnormalities.  Explained that the purpose of today's ultrasound is to assess for presentation, BPP and amniotic fluid volume.  Patient acknowledges the purpose of the exam and the limitations of the study.    Pt reports intermittent dizziness and H/A on 8/6, 8/7 and today - relieved with Tylenol and rest. Pre-e labs obtained. Pt advised to go to MAU for worsening H/A or worsening sx of pre-e. She voiced understanding.

## 2020-12-14 LAB — COMPREHENSIVE METABOLIC PANEL
ALT: 7 IU/L (ref 0–32)
AST: 15 IU/L (ref 0–40)
Albumin/Globulin Ratio: 1.3 (ref 1.2–2.2)
Albumin: 3.6 g/dL — ABNORMAL LOW (ref 3.8–4.8)
Alkaline Phosphatase: 106 IU/L (ref 44–121)
BUN/Creatinine Ratio: 6 — ABNORMAL LOW (ref 9–23)
BUN: 3 mg/dL — ABNORMAL LOW (ref 6–20)
Bilirubin Total: <0.2 mg/dL (ref 0.0–1.2)
CO2: 20 mmol/L (ref 20–29)
Calcium: 8.6 mg/dL — ABNORMAL LOW (ref 8.7–10.2)
Chloride: 107 mmol/L — ABNORMAL HIGH (ref 96–106)
Creatinine, Ser: 0.5 mg/dL — ABNORMAL LOW (ref 0.57–1.00)
Globulin, Total: 2.7 g/dL (ref 1.5–4.5)
Glucose: 97 mg/dL (ref 65–99)
Potassium: 3.7 mmol/L (ref 3.5–5.2)
Sodium: 139 mmol/L (ref 134–144)
Total Protein: 6.3 g/dL (ref 6.0–8.5)
eGFR: 127 mL/min/{1.73_m2} (ref >59)

## 2020-12-14 LAB — CBC
Hematocrit: 35.1 % (ref 34.0–46.6)
Hemoglobin: 11.6 g/dL (ref 11.1–15.9)
MCH: 25.8 pg — ABNORMAL LOW (ref 26.6–33.0)
MCHC: 33 g/dL (ref 31.5–35.7)
MCV: 78 fL — ABNORMAL LOW (ref 79–97)
Platelets: 316 10*3/uL (ref 150–450)
RBC: 4.5 x10E6/uL (ref 3.77–5.28)
RDW: 18.8 % — ABNORMAL HIGH (ref 11.7–15.4)
WBC: 8.1 10*3/uL (ref 3.4–10.8)

## 2020-12-14 LAB — PROTEIN / CREATININE RATIO, URINE
Creatinine, Urine: 97.2 mg/dL
Protein, Ur: 24.9 mg/dL
Protein/Creat Ratio: 256 mg/g creat — ABNORMAL HIGH (ref 0–200)

## 2020-12-20 ENCOUNTER — Encounter: Payer: Self-pay | Admitting: Family Medicine

## 2020-12-20 ENCOUNTER — Ambulatory Visit: Payer: Self-pay | Admitting: *Deleted

## 2020-12-20 ENCOUNTER — Ambulatory Visit (INDEPENDENT_AMBULATORY_CARE_PROVIDER_SITE_OTHER): Payer: Self-pay | Admitting: Family Medicine

## 2020-12-20 ENCOUNTER — Other Ambulatory Visit: Payer: Self-pay

## 2020-12-20 ENCOUNTER — Ambulatory Visit (INDEPENDENT_AMBULATORY_CARE_PROVIDER_SITE_OTHER): Payer: Self-pay

## 2020-12-20 VITALS — BP 150/103 | HR 90 | Wt 169.7 lb

## 2020-12-20 DIAGNOSIS — O133 Gestational [pregnancy-induced] hypertension without significant proteinuria, third trimester: Secondary | ICD-10-CM

## 2020-12-20 DIAGNOSIS — O0990 Supervision of high risk pregnancy, unspecified, unspecified trimester: Secondary | ICD-10-CM

## 2020-12-20 DIAGNOSIS — Z3A33 33 weeks gestation of pregnancy: Secondary | ICD-10-CM

## 2020-12-20 NOTE — Patient Instructions (Signed)
Eleccin del mtodo anticonceptivo Contraception Choices La anticoncepcin, o los mtodos anticonceptivos, hace referencia a los mtodos o dispositivos que evitan el embarazo. Mtodos hormonales Implante anticonceptivo Un implante anticonceptivo consiste en un tubo delgado de plstico que contiene una hormona que evita el embarazo. Es diferente de un dispositivo intrauterino (DIU). Un mdico lo inserta en la parte superior del brazo. Los implantes pueden ser eficaces durante un mximo de 3 aos. Inyecciones de progestina sola Las inyecciones de progestina sola contienen progestina, una forma sinttica de la hormona progesterona. Un mdico las administra cada 3 meses. Pldoras anticonceptivas Las pldoras anticonceptivas son pastillas que contienen hormonas que evitan el embarazo. Deben tomarse una vez al da, preferentemente a la misma hora cada da. Se necesita una receta para utilizar este mtodo anticonceptivo. Parche anticonceptivo El parche anticonceptivo contiene hormonas que evitan el embarazo. Se coloca en la piel, debe cambiarse una vez a la semana durante tres semanas y debe retirarse en la cuarta semana. Se necesita una receta para utilizar este mtodo anticonceptivo. Anillo vaginal Un anillo vaginal contiene hormonas que evitan el embarazo. Se coloca en la vagina durante tres semanas y se retira en la cuarta semana. Luego se repite el proceso con un anillo nuevo. Se necesita una receta para utilizar este mtodo anticonceptivo. Anticonceptivo de emergencia Los anticonceptivos de emergencia son mtodos para evitar un embarazo despus de tener sexo sin proteccin. Vienen en forma de pldora y pueden tomarse hasta 5 das despus de tener sexo. Funcionan mejor cuando se toman lo ms pronto posible luego de tener sexo. La mayora de los anticonceptivos de emergencia estn disponibles sin receta mdica. Este mtodo no debe utilizarse como el nico mtodo anticonceptivo. Mtodos de  barrera Condn masculino Un condn masculino es una vaina delgada que se coloca sobre el pene durante el sexo. Los condones evitan que el esperma ingrese en el cuerpo de la mujer. Pueden utilizarse con un una sustancia que mata a los espermatozoides (espermicida) para aumentar la efectividad. Deben desecharse despus de un uso. Condn femenino Un condn femenino es una vaina blanda y holgada que se coloca en la vagina antes de tener sexo. El condn evita que el esperma ingrese en el cuerpo de la mujer. Deben desecharse despus de un uso. Diafragma Un diafragma es una barrera blanda con forma de cpula. Se inserta en la vagina antes del sexo, junto con un espermicida. El diafragma bloquea el ingreso de esperma en el tero, y el espermicida mata a los espermatozoides. El diafragma debe permanecer en la vagina durante 6 a 8 horas despus de tener sexo y debe retirarse en el plazo de las 24 horas. Un diafragma es recetado y colocado por un mdico. Debe reemplazarse cada 1 a 2 aos, despus de dar a luz, de aumentar ms de 15lb (6.8kg) y de una ciruga plvica. Capuchn cervical Un capuchn cervical es una copa redonda y blanda de ltex o plstico que se coloca en el cuello uterino. Se inserta en la vagina antes del sexo, junto con un espermicida. Bloquea el ingreso del esperma en el tero. El capuchn debe permanecer en el lugar durante 6 a 8 horas despus de tener sexo y debe retirarse en el plazo de las 48 horas. Un capuchn cervical debe ser recetado y colocado por un mdico. Debe reemplazarse cada 2aos. Esponja Una esponja es una pieza blanda y circular de espuma de poliuretano que contiene espermicida. La esponja ayuda a bloquear el ingreso de esperma en el tero, y el espermicida mata a   los espermatozoides. Para utilizarla, debe humedecerla e insertarla en la vagina. Debe insertarse antes de tener sexo, debe permanecer dentro al menos durante 6 horas despus de tener sexo y debe retirarse y  desecharse en el plazo de las 30 horas. Espermicidas Los espermicidas son sustancias qumicas que matan o bloquean al esperma y no lo dejan ingresar al cuello uterino y al tero. Vienen en forma de crema, gel, supositorio, espuma o comprimido. Un espermicida debe insertarse en la vagina con un aplicador al menos 10 o 15 minutos antes de tener sexo para dar tiempo a que surta efecto. El proceso debe repetirse cada vez que tenga sexo. Los espermicidas no requieren receta mdica. Anticonceptivos intrauterinos Dispositivo intrauterino (DIU) Un DIU es un dispositivo en forma de T que se coloca en el tero. Existen dos tipos: DIU hormonal.Este tipo contiene progestina, una forma sinttica de la hormona progesterona. Este tipo puede permanecer colocado durante 3 a 5 aos. DIU de cobre.Este tipo est recubierto con un alambre de cobre. Puede permanecer colocado durante 10 aos. Mtodos anticonceptivos permanentes Ligadura de trompas en la mujer En este mtodo, se sellan, atan u obstruyen las trompas de Falopio durante una ciruga para evitar que el vulo descienda hacia el tero. Esterilizacin histeroscpica En este mtodo, se coloca un implante pequeo y flexible dentro de cada trompa de Falopio. Los implantes hacen que se forme un tejido cicatricial en las trompas de Falopio y que las obstruya para que el espermatozoide no pueda llegar al vulo. El procedimiento demora alrededor de 3 meses para que sea efectivo. Debe utilizarse otro mtodo anticonceptivo durante esos 3 meses. Esterilizacin masculina Este es un procedimiento que consiste en atar los conductos que transportan el esperma (vasectoma). Luego del procedimiento, el hombre puede eyacular lquido (semen). Debe utilizarse otro mtodo anticonceptivo durante 3 meses despus del procedimiento. Mtodos de planificacin natural Planificacin familiar natural En este mtodo, la pareja no tiene sexo durante los das en que la mujer podra quedar  embarazada. Mtodo calendario En este mtodo, la mujer realiza un seguimiento de la duracin de cada ciclo menstrual, identifica los das en los que se puede producir un embarazo y no tiene sexo durante esos das. Mtodo de la ovulacin En este mtodo, la pareja evita tener sexo durante la ovulacin. Mtodo sintotrmico Este mtodo implica no tener sexo durante la ovulacin. Normalmente, la mujer comprueba la ovulacin al observar cambios en su temperatura y en la consistencia del moco cervical. Mtodo posovulacin En este mtodo, la pareja espera a que finalice la ovulacin para tener sexo. Dnde buscar ms informacin Centers for Disease Control and Prevention (Centros para el Control y la Prevencin de Enfermedades): www.cdc.gov Resumen La anticoncepcin, o los mtodos anticonceptivos, hace referencia a los mtodos o dispositivos que evitan el embarazo. Los mtodos anticonceptivos hormonales incluyen implantes, inyecciones, pastillas, parches, anillos vaginales y anticonceptivos de emergencia. Los mtodos anticonceptivos de barrera pueden incluir condones masculinos, condones femeninos, diafragmas, capuchones cervicales, esponjas y espermicidas. Existen dos tipos de DIU (dispositivo intrauterino). Un DIU puede colocarse en el tero de una mujer para evitar el embarazo durante 3 a 5 aos. La esterilizacin permanente puede realizarse mediante un procedimiento tanto en los hombres como en las mujeres. Los mtodos de planificacin familiar natural implican no tener sexo durante los das en que la mujer podra quedar embarazada. Esta informacin no tiene como fin reemplazar el consejo del mdico. Asegrese de hacerle al mdico cualquier pregunta que tenga. Document Revised: 11/23/2019 Document Reviewed: 11/23/2019 Elsevier Patient Education  2022 Elsevier   Inc.  

## 2020-12-20 NOTE — Progress Notes (Signed)
   Subjective:  Holly Gutierrez is a 35 y.o. G3P2002 at [redacted]w[redacted]d being seen today for ongoing prenatal care.  She is currently monitored for the following issues for this high-risk pregnancy and has Indication for care in labor or delivery; NVD (normal vaginal delivery); Supervision of high risk pregnancy, antepartum; [redacted] weeks gestation of pregnancy; and Gestational hypertension on their problem list.  Patient reports no complaints.  Contractions: Not present. Vag. Bleeding: None.   . Denies leaking of fluid.   The following portions of the patient's history were reviewed and updated as appropriate: allergies, current medications, past family history, past medical history, past social history, past surgical history and problem list. Problem list updated.  Objective:   Vitals:   12/20/20 1421 12/20/20 1429  BP: (!) 153/104 (!) 150/103  Pulse: 90 90  Weight: 169 lb 11.2 oz (77 kg)     Fetal Status: Fetal Heart Rate (bpm): 138         General:  Alert, oriented and cooperative. Patient is in no acute distress.  Skin: Skin is warm and dry. No rash noted.   Cardiovascular: Normal heart rate noted  Respiratory: Normal respiratory effort, no problems with respiration noted  Abdomen: Soft, gravid, appropriate for gestational age. Pain/Pressure: Present     Pelvic: Vag. Bleeding: None Vag D/C Character: Yellow   Cervical exam deferred        Extremities: Normal range of motion.     Mental Status: Normal mood and affect. Normal behavior. Normal judgment and thought content.   Urinalysis:      Assessment and Plan:  Pregnancy: G3P2002 at [redacted]w[redacted]d  1. Supervision of high risk pregnancy, antepartum BP elevated, see below FHR normal FH 32 cm  2. Gestational hypertension, third trimester Bp continuing to creep Intermittent headaches but resolve with tylenol Reviewed PreE symptoms and severe range BP's in detail that should prompt MAU eval Has NST/BPP scheduled for later this afternoon, has  been reassuring to date Has growth [place] scheduled for next week  Preterm labor symptoms and general obstetric precautions including but not limited to vaginal bleeding, contractions, leaking of fluid and fetal movement were reviewed in detail with the patient. Please refer to After Visit Summary for other counseling recommendations.  Return in 2 weeks (on 01/03/2021) for [facility], ob visit, needs MD.   Venora Maples, MD

## 2020-12-27 ENCOUNTER — Other Ambulatory Visit: Payer: Self-pay

## 2020-12-29 ENCOUNTER — Ambulatory Visit: Payer: Self-pay | Attending: Family Medicine

## 2020-12-29 ENCOUNTER — Other Ambulatory Visit: Payer: Self-pay

## 2020-12-29 ENCOUNTER — Ambulatory Visit: Payer: Self-pay | Admitting: *Deleted

## 2020-12-29 ENCOUNTER — Encounter: Payer: Self-pay | Admitting: Family Medicine

## 2020-12-29 ENCOUNTER — Encounter: Payer: Self-pay | Admitting: *Deleted

## 2020-12-29 VITALS — BP 143/94 | HR 70

## 2020-12-29 DIAGNOSIS — O0990 Supervision of high risk pregnancy, unspecified, unspecified trimester: Secondary | ICD-10-CM

## 2020-12-29 DIAGNOSIS — O133 Gestational [pregnancy-induced] hypertension without significant proteinuria, third trimester: Secondary | ICD-10-CM | POA: Insufficient documentation

## 2021-01-01 ENCOUNTER — Encounter: Payer: Self-pay | Admitting: Family Medicine

## 2021-01-01 DIAGNOSIS — O3660X0 Maternal care for excessive fetal growth, unspecified trimester, not applicable or unspecified: Secondary | ICD-10-CM | POA: Insufficient documentation

## 2021-01-03 ENCOUNTER — Other Ambulatory Visit: Payer: Self-pay

## 2021-01-03 ENCOUNTER — Ambulatory Visit (INDEPENDENT_AMBULATORY_CARE_PROVIDER_SITE_OTHER): Payer: Self-pay

## 2021-01-03 ENCOUNTER — Ambulatory Visit (INDEPENDENT_AMBULATORY_CARE_PROVIDER_SITE_OTHER): Payer: Self-pay | Admitting: Family Medicine

## 2021-01-03 ENCOUNTER — Encounter: Payer: Self-pay | Admitting: Family Medicine

## 2021-01-03 ENCOUNTER — Ambulatory Visit: Payer: Self-pay | Admitting: *Deleted

## 2021-01-03 VITALS — BP 156/98 | HR 62 | Wt 174.0 lb

## 2021-01-03 DIAGNOSIS — O133 Gestational [pregnancy-induced] hypertension without significant proteinuria, third trimester: Secondary | ICD-10-CM

## 2021-01-03 DIAGNOSIS — O0990 Supervision of high risk pregnancy, unspecified, unspecified trimester: Secondary | ICD-10-CM

## 2021-01-03 DIAGNOSIS — O3663X0 Maternal care for excessive fetal growth, third trimester, not applicable or unspecified: Secondary | ICD-10-CM

## 2021-01-03 DIAGNOSIS — Z3A35 35 weeks gestation of pregnancy: Secondary | ICD-10-CM

## 2021-01-03 NOTE — Progress Notes (Signed)
Pt reports having cramps all over her body - feet, legs, stomach, chest and arms.

## 2021-01-03 NOTE — Progress Notes (Signed)
   Subjective:  Holly Gutierrez is a 35 y.o. G3P2002 at [redacted]w[redacted]d being seen today for ongoing prenatal care.  She is currently monitored for the following issues for this high-risk pregnancy and has Indication for care in labor or delivery; NVD (normal vaginal delivery); Supervision of high risk pregnancy, antepartum; [redacted] weeks gestation of pregnancy; Gestational hypertension; and LGA (large for gestational age) fetus affecting management of mother on their problem list.  Patient reports no complaints.  Contractions: Not present. Vag. Bleeding: None.  Movement: Present. Denies leaking of fluid.   The following portions of the patient's history were reviewed and updated as appropriate: allergies, current medications, past family history, past medical history, past social history, past surgical history and problem list. Problem list updated.  Objective:   Vitals:   01/03/21 1440  BP: (!) 156/98  Pulse: 62  Weight: 174 lb (78.9 kg)    Fetal Status: Fetal Heart Rate (bpm): NST   Movement: Present     General:  Alert, oriented and cooperative. Patient is in no acute distress.  Skin: Skin is warm and dry. No rash noted.   Cardiovascular: Normal heart rate noted  Respiratory: Normal respiratory effort, no problems with respiration noted  Abdomen: Soft, gravid, appropriate for gestational age. Pain/Pressure: Present     Pelvic: Vag. Bleeding: None     Cervical exam deferred        Extremities: Normal range of motion.     Mental Status: Normal mood and affect. Normal behavior. Normal judgment and thought content.   Urinalysis:      Assessment and Plan:  Pregnancy: G3P2002 at [redacted]w[redacted]d  1. Supervision of high risk pregnancy, antepartum BP elevated but not severe range FHR normal, NST reactive Unstable lie noted on prior US At 37 weeks [facility] will be on call, will message them to make sure they are comfortable with breech induction if necessary Will also schedule for IOL/version Noted  after visit to need swabs, will collect at next visit  2. Gestational hypertension, third trimester BP elevated but not severe range Denies symptoms BPP 10/10 today Scheduling for IOL at 37 weeks, orders placed  3. Excessive fetal growth affecting management of pregnancy in third trimester, single or unspecified fetus LGA, 94% on last scan  Preterm labor symptoms and general obstetric precautions including but not limited to vaginal bleeding, contractions, leaking of fluid and fetal movement were reviewed in detail with the patient. Please refer to After Visit Summary for other counseling recommendations.  Return in 1 week (on 01/10/2021) for [facility], ob visit, needs MD.   Venora Maples, MD

## 2021-01-03 NOTE — Patient Instructions (Signed)

## 2021-01-08 ENCOUNTER — Other Ambulatory Visit: Payer: Self-pay | Admitting: Advanced Practice Midwife

## 2021-01-09 ENCOUNTER — Telehealth (HOSPITAL_COMMUNITY): Payer: Self-pay | Admitting: *Deleted

## 2021-01-09 ENCOUNTER — Other Ambulatory Visit: Payer: Self-pay

## 2021-01-09 ENCOUNTER — Encounter (HOSPITAL_COMMUNITY): Payer: Self-pay | Admitting: *Deleted

## 2021-01-09 ENCOUNTER — Encounter (HOSPITAL_COMMUNITY): Payer: Self-pay | Admitting: Obstetrics and Gynecology

## 2021-01-09 ENCOUNTER — Inpatient Hospital Stay (HOSPITAL_COMMUNITY)
Admission: AD | Admit: 2021-01-09 | Discharge: 2021-01-13 | DRG: 807 | Disposition: A | Discharge status: Home / Self Care | Payer: Medicaid Other | Attending: Obstetrics & Gynecology | Admitting: Obstetrics & Gynecology

## 2021-01-09 DIAGNOSIS — O1414 Severe pre-eclampsia complicating childbirth: Secondary | ICD-10-CM | POA: Diagnosis present

## 2021-01-09 DIAGNOSIS — O3663X0 Maternal care for excessive fetal growth, third trimester, not applicable or unspecified: Secondary | ICD-10-CM | POA: Diagnosis present

## 2021-01-09 DIAGNOSIS — Z3A36 36 weeks gestation of pregnancy: Secondary | ICD-10-CM

## 2021-01-09 DIAGNOSIS — Z20822 Contact with and (suspected) exposure to covid-19: Secondary | ICD-10-CM | POA: Diagnosis present

## 2021-01-09 DIAGNOSIS — O0990 Supervision of high risk pregnancy, unspecified, unspecified trimester: Secondary | ICD-10-CM

## 2021-01-09 DIAGNOSIS — O3660X0 Maternal care for excessive fetal growth, unspecified trimester, not applicable or unspecified: Secondary | ICD-10-CM | POA: Diagnosis present

## 2021-01-09 DIAGNOSIS — O139 Gestational [pregnancy-induced] hypertension without significant proteinuria, unspecified trimester: Secondary | ICD-10-CM | POA: Diagnosis present

## 2021-01-09 LAB — URINALYSIS, ROUTINE W REFLEX MICROSCOPIC
Bilirubin Urine: NEGATIVE
Glucose, UA: NEGATIVE mg/dL
Hgb urine dipstick: NEGATIVE
Ketones, ur: NEGATIVE mg/dL
Nitrite: NEGATIVE
Protein, ur: NEGATIVE mg/dL
Specific Gravity, Urine: 1.016 (ref 1.005–1.030)
pH: 6 (ref 5.0–8.0)

## 2021-01-09 LAB — TYPE AND SCREEN
ABO/RH(D): O POS
Antibody Screen: NEGATIVE

## 2021-01-09 LAB — COMPREHENSIVE METABOLIC PANEL
ALT: 10 U/L (ref 0–44)
AST: 15 U/L (ref 15–41)
Albumin: 2.6 g/dL — ABNORMAL LOW (ref 3.5–5.0)
Alkaline Phosphatase: 120 U/L (ref 38–126)
Anion gap: 8 (ref 5–15)
BUN: 8 mg/dL (ref 6–20)
CO2: 19 mmol/L — ABNORMAL LOW (ref 22–32)
Calcium: 8.3 mg/dL — ABNORMAL LOW (ref 8.9–10.3)
Chloride: 108 mmol/L (ref 98–111)
Creatinine, Ser: 0.48 mg/dL (ref 0.44–1.00)
GFR, Estimated: >60 mL/min (ref >60)
Glucose, Bld: 103 mg/dL — ABNORMAL HIGH (ref 70–99)
Potassium: 3.3 mmol/L — ABNORMAL LOW (ref 3.5–5.1)
Sodium: 135 mmol/L (ref 135–145)
Total Bilirubin: 0.3 mg/dL (ref 0.3–1.2)
Total Protein: 5.9 g/dL — ABNORMAL LOW (ref 6.5–8.1)

## 2021-01-09 LAB — CBC
HCT: 37 % (ref 36.0–46.0)
Hemoglobin: 12.1 g/dL (ref 12.0–15.0)
MCH: 27 pg (ref 26.0–34.0)
MCHC: 32.7 g/dL (ref 30.0–36.0)
MCV: 82.6 fL (ref 80.0–100.0)
Platelets: 290 10*3/uL (ref 150–400)
RBC: 4.48 MIL/uL (ref 3.87–5.11)
RDW: 18.4 % — ABNORMAL HIGH (ref 11.5–15.5)
WBC: 8.2 10*3/uL (ref 4.0–10.5)
nRBC: 0 % (ref 0.0–0.2)

## 2021-01-09 LAB — RESP PANEL BY RT-PCR (FLU A&B, COVID) ARPGX2
Influenza A by PCR: NEGATIVE
Influenza B by PCR: NEGATIVE
SARS Coronavirus 2 by RT PCR: NEGATIVE

## 2021-01-09 LAB — PROTEIN / CREATININE RATIO, URINE
Creatinine, Urine: 105.17 mg/dL
Protein Creatinine Ratio: 0.1 mg/mg{Cre} (ref 0.00–0.15)
Total Protein, Urine: 11 mg/dL

## 2021-01-09 LAB — GROUP B STREP BY PCR: Group B strep by PCR: NEGATIVE

## 2021-01-09 MED ORDER — MISOPROSTOL 50MCG HALF TABLET: 50.0000 ug | ORAL_TABLET | ORAL | Status: DC

## 2021-01-09 MED ORDER — MISOPROSTOL 50MCG HALF TABLET
50.0000 ug | ORAL_TABLET | ORAL | Status: DC
  Administered 2021-01-09 – 2021-01-11 (×6): 50 ug via BUCCAL
  Filled 2021-01-09 (×6): qty 1

## 2021-01-09 MED ORDER — ONDANSETRON HCL 4 MG/2ML IJ SOLN: 4.0000 mg | Freq: Four times a day (QID) | INTRAMUSCULAR | Status: DC | PRN

## 2021-01-09 MED ORDER — LACTATED RINGERS IV SOLN: INTRAVENOUS | Status: DC

## 2021-01-09 MED ORDER — MAGNESIUM SULFATE 40 GM/1000ML IV SOLN
2.0000 g/h | INTRAVENOUS | Status: DC
  Administered 2021-01-10 – 2021-01-12 (×2): 2 g/h via INTRAVENOUS
  Filled 2021-01-09 (×5): qty 1000

## 2021-01-09 MED ORDER — DIPHENHYDRAMINE HCL 50 MG/ML IJ SOLN
25.0000 mg | Freq: Once | INTRAMUSCULAR | Status: AC
  Administered 2021-01-09: 25 mg via INTRAVENOUS
  Filled 2021-01-09: qty 1

## 2021-01-09 MED ORDER — FENTANYL CITRATE (PF) 100 MCG/2ML IJ SOLN
50.0000 ug | INTRAMUSCULAR | Status: DC | PRN
  Administered 2021-01-11: 50 ug via INTRAVENOUS
  Filled 2021-01-09: qty 2

## 2021-01-09 MED ORDER — LABETALOL HCL 5 MG/ML IV SOLN
20.0000 mg | INTRAVENOUS | Status: DC | PRN
  Administered 2021-01-09 – 2021-01-10 (×4): 20 mg via INTRAVENOUS
  Filled 2021-01-09 (×4): qty 4

## 2021-01-09 MED ORDER — DEXAMETHASONE SODIUM PHOSPHATE 10 MG/ML IJ SOLN
10.0000 mg | Freq: Once | INTRAMUSCULAR | Status: AC
  Administered 2021-01-09: 10 mg via INTRAVENOUS
  Filled 2021-01-09: qty 1

## 2021-01-09 MED ORDER — LACTATED RINGERS IV SOLN
500.0000 mL | INTRAVENOUS | Status: DC | PRN
Start: 2021-01-09 — End: 2021-01-12

## 2021-01-09 MED ORDER — SOD CITRATE-CITRIC ACID 500-334 MG/5ML PO SOLN: 30.0000 mL | ORAL | Status: DC | PRN

## 2021-01-09 MED ORDER — OXYTOCIN-SODIUM CHLORIDE 30-0.9 UT/500ML-% IV SOLN
2.5000 [IU]/h | INTRAVENOUS | Status: DC
  Administered 2021-01-11: 2.5 [IU]/h via INTRAVENOUS
  Filled 2021-01-09: qty 500

## 2021-01-09 MED ORDER — OXYCODONE-ACETAMINOPHEN 5-325 MG PO TABS: 2.0000 | ORAL_TABLET | ORAL | Status: DC | PRN

## 2021-01-09 MED ORDER — PENICILLIN G POT IN DEXTROSE 60000 UNIT/ML IV SOLN
3.0000 10*6.[IU] | INTRAVENOUS | Status: DC
Start: 2021-01-10 — End: 2021-01-12
  Administered 2021-01-10 – 2021-01-11 (×8): 3 10*6.[IU] via INTRAVENOUS
  Filled 2021-01-09 (×9): qty 50

## 2021-01-09 MED ORDER — TERBUTALINE SULFATE 1 MG/ML IJ SOLN
0.2500 mg | Freq: Once | INTRAMUSCULAR | Status: DC | PRN
Start: 2021-01-09 — End: 2021-01-12

## 2021-01-09 MED ORDER — LABETALOL HCL 5 MG/ML IV SOLN: 80.0000 mg | INTRAVENOUS | Status: DC | PRN

## 2021-01-09 MED ORDER — METOCLOPRAMIDE HCL 5 MG/ML IJ SOLN
10.0000 mg | Freq: Once | INTRAMUSCULAR | Status: AC
  Administered 2021-01-09: 10 mg via INTRAVENOUS
  Filled 2021-01-09: qty 2

## 2021-01-09 MED ORDER — ACETAMINOPHEN 325 MG PO TABS
650.0000 mg | ORAL_TABLET | ORAL | Status: DC | PRN
  Administered 2021-01-10 (×2): 650 mg via ORAL
  Filled 2021-01-09 (×2): qty 2

## 2021-01-09 MED ORDER — LIDOCAINE HCL (PF) 1 % IJ SOLN
30.0000 mL | INTRAMUSCULAR | Status: AC | PRN
  Administered 2021-01-11: 30 mL via SUBCUTANEOUS
  Filled 2021-01-09: qty 30

## 2021-01-09 MED ORDER — MAGNESIUM SULFATE BOLUS VIA INFUSION
4.0000 g | Freq: Once | INTRAVENOUS | Status: AC
  Administered 2021-01-09: 4 g via INTRAVENOUS
  Filled 2021-01-09: qty 1000

## 2021-01-09 MED ORDER — SODIUM CHLORIDE 0.9 % IV SOLN
5.0000 10*6.[IU] | Freq: Once | INTRAVENOUS | Status: AC
  Administered 2021-01-09: 5 10*6.[IU] via INTRAVENOUS
  Filled 2021-01-09: qty 5

## 2021-01-09 MED ORDER — LABETALOL HCL 5 MG/ML IV SOLN: 40.0000 mg | INTRAVENOUS | Status: DC | PRN

## 2021-01-09 MED ORDER — OXYTOCIN BOLUS FROM INFUSION
333.0000 mL | Freq: Once | INTRAVENOUS | Status: AC
  Administered 2021-01-11: 333 mL via INTRAVENOUS

## 2021-01-09 MED ORDER — OXYCODONE-ACETAMINOPHEN 5-325 MG PO TABS: 1.0000 | ORAL_TABLET | ORAL | Status: DC | PRN

## 2021-01-09 NOTE — Telephone Encounter (Signed)
Preadmission screen Interpreter number [phone] C/o headache all day today.  Instructed to come to hospital for evaluation

## 2021-01-09 NOTE — H&P (Addendum)
OBSTETRIC ADMISSION HISTORY AND PHYSICAL  Holly Gutierrez is a 35 y.o. female [phone] with IUP at [redacted]w[redacted]d by certain LMP presenting for IOL for Pre-E with severe features. She reports +FMs, no LOF, no VB, no blurry vision, headaches, peripheral edema, or RUQ pain.  She plans on breast feeding. She requests an outpatient nexplanon for birth control.  She received her prenatal care at [facility] initially and transferred care to [facility] for the remainder of her pregnancy.  Dating: By certain LMP --->  Estimated Date of Delivery: 02/03/21  Sono:   @[redacted]w[redacted]d , CWD, normal anatomy, unstable lie presentation, 3073g, 94% EFW  Prenatal History/Complications:  Gestational HTN  Past Medical History: Past Medical History:  Diagnosis Date   Medical history non-contributory    Pregnancy induced hypertension     Past Surgical History: Past Surgical History:  Procedure Laterality Date   CHOLECYSTECTOMY      Obstetrical History: OB History     Gravida  3   Para  2   Term  2   Preterm  0   AB  0   Living  2      SAB  0   IAB  0   Ectopic  0   Multiple  0   Live Births  2           Social History Social History   Socioeconomic History   Marital status: Married    Spouse name: Not on file   Number of children: Not on file   Years of education: Not on file   Highest education level: Not on file  Occupational History   Not on file  Tobacco Use   Smoking status: Never   Smokeless tobacco: Never  Vaping Use   Vaping Use: Never used  Substance and Sexual Activity   Alcohol use: No   Drug use: No   Sexual activity: Yes    Partners: Male    Birth control/protection: None  Other Topics Concern   Not on file  Social History Narrative   Not on file   Social Determinants of Health   Financial Resource Strain: Not on file  Food Insecurity: No Food Insecurity   Worried About Running Out of Food in the Last Year: Never true   Ran Out of Food in the Last Year: Never true   Transportation Needs: No Transportation Needs   Lack of Transportation (Medical): No   Lack of Transportation (Non-Medical): No  Physical Activity: Not on file  Stress: Not on file  Social Connections: Not on file    Family History: Family History  Problem Relation Age of Onset   Hypertension Mother     Allergies: No Known Allergies  Medications Prior to Admission  Medication Sig Dispense Refill Last Dose   aspirin 81 MG EC tablet Take 81 mg by mouth daily. Swallow whole.   01/09/2021   ferrous sulfate 325 (65 FE) MG tablet Take 1 tablet (325 mg total) by mouth every other day. 30 tablet 3 01/09/2021   Prenatal Vit-Fe Fumarate-FA (MULTIVITAMIN-PRENATAL) 27-0.8 MG TABS tablet Take 1 tablet by mouth daily at 12 noon.   01/08/2021   Magnesium 400 MG TABS Take 1 tablet by mouth daily as needed (headache). 30 tablet 1      Review of Systems   All systems reviewed and negative except as stated in HPI  Blood pressure (!) 158/95, pulse 73, temperature 98.2 F (36.8 C), temperature source Oral, resp. rate 18, height 5\' 6"  (1.676  m), weight 74.8 kg, last menstrual period 04/29/2020, SpO2 98 %, unknown if currently breastfeeding.  General appearance: alert, cooperative, and no distress Lungs: normal work of breathing on room air  Heart: normal rate, warm and well perfused  Abdomen: soft, non-tender, gravid Extremities: no LE edema, no calf tenderness to palpation  Presentation: Cephalic by BSUS  Fetal monitoring: Baseline: 135 bpm, moderate variability, + accels, no decels Uterine activity: Occasional contractions  Dilation: Closed Effacement (%): 50 Station: -3 Exam by:: C.Brinson Tozzi MD   Prenatal labs: ABO, Rh: --/--/O POS (09/06 1900) Antibody: NEG (09/06 1900) Rubella: Immune (06/23 0000) RPR: Nonreactive (06/23 0000)  HBsAg: Negative (06/23 0000)  HIV: Non-reactive (06/23 0000)  GBS:  Unknown  1 hr Glucola normal Genetic screening - Not done Anatomy US WNL  Prenatal  Transfer Tool  Maternal Diabetes: No Genetic Screening: Not done Maternal Ultrasounds/Referrals: Normal Fetal Ultrasounds or other Referrals: Referred to Materal Fetal Medicine  Maternal Substance Abuse:  No Significant Maternal Medications:  None Significant Maternal Lab Results: Other: GBS unknown  Results for orders placed or performed during the hospital encounter of 01/09/21 (from the past 24 hour(s))  Urinalysis, Routine w reflex microscopic Urine, Clean Catch   Collection Time: 01/09/21  6:08 PM  Result Value Ref Range   Color, Urine YELLOW YELLOW   APPearance CLEAR CLEAR   Specific Gravity, Urine 1.016 1.005 - 1.030   pH 6.0 5.0 - 8.0   Glucose, UA NEGATIVE NEGATIVE mg/dL   Hgb urine dipstick NEGATIVE NEGATIVE   Bilirubin Urine NEGATIVE NEGATIVE   Ketones, ur NEGATIVE NEGATIVE mg/dL   Protein, ur NEGATIVE NEGATIVE mg/dL   Nitrite NEGATIVE NEGATIVE   Leukocytes,Ua SMALL (A) NEGATIVE   RBC / HPF 0-5 0 - 5 RBC/hpf   WBC, UA 0-5 0 - 5 WBC/hpf   Bacteria, UA RARE (A) NONE SEEN   Squamous Epithelial / LPF 6-10 0 - 5   Mucus PRESENT   Protein / creatinine ratio, urine   Collection Time: 01/09/21  6:41 PM  Result Value Ref Range   Creatinine, Urine 105.17 mg/dL   Total Protein, Urine 11 mg/dL   Protein Creatinine Ratio 0.10 0.00 - 0.15 mg/mg[Cre]  Comprehensive metabolic panel   Collection Time: 01/09/21  7:00 PM  Result Value Ref Range   Sodium 135 135 - 145 mmol/L   Potassium 3.3 (L) 3.5 - 5.1 mmol/L   Chloride 108 98 - 111 mmol/L   CO2 19 (L) 22 - 32 mmol/L   Glucose, Bld 103 (H) 70 - 99 mg/dL   BUN 8 6 - 20 mg/dL   Creatinine, Ser 3.49 0.44 - 1.00 mg/dL   Calcium 8.3 (L) 8.9 - 10.3 mg/dL   Total Protein 5.9 (L) 6.5 - 8.1 g/dL   Albumin 2.6 (L) 3.5 - 5.0 g/dL   AST 15 15 - 41 U/L   ALT 10 0 - 44 U/L   Alkaline Phosphatase 120 38 - 126 U/L   Total Bilirubin 0.3 0.3 - 1.2 mg/dL   GFR, Estimated >17 >91 mL/min   Anion gap 8 5 - 15  CBC   Collection Time:  01/09/21  7:00 PM  Result Value Ref Range   WBC 8.2 4.0 - 10.5 K/uL   RBC 4.48 3.87 - 5.11 MIL/uL   Hemoglobin 12.1 12.0 - 15.0 g/dL   HCT 50.5 69.7 - 94.8 %   MCV 82.6 80.0 - 100.0 fL   MCH 27.0 26.0 - 34.0 pg   MCHC 32.7 30.0 -  36.0 g/dL   RDW 26.3 (H) 78.5 - 88.5 %   Platelets 290 150 - 400 K/uL   nRBC 0.0 0.0 - 0.2 %  Type and screen [facility]   Collection Time: 01/09/21  7:00 PM  Result Value Ref Range   ABO/RH(D) O POS    Antibody Screen NEG    Sample Expiration      01/12/2021,2359 Performed at [facility], [address], [place], [place] [address]     Patient Active Problem List   Diagnosis Date Noted   LGA (large for gestational age) fetus affecting management of mother 01/01/2021   Supervision of high risk pregnancy, antepartum 11/16/2020   Gestational hypertension 11/16/2020    Assessment/Plan:  Holly Gutierrez is a 35 y.o. G3P2002 at [redacted]w[redacted]d here for IOL due to Pre-E with severe features.  #Labor: Patient being admitted to L&D from MAU due to severe range blood pressures and diagnosis of Pre-E. SVE 0/50/-3. Will start induction with buccal Cytotec and attempt FB placement when able.  #Pre-eclampsia: Asymptomatic at this time. On Mag. BP protocol in place; will continue to treat for severe range pressures. Will continue to monitor.  #Pain: PRN, planning for epidural #FWB: Cat I #ID:  GBS unknown  -PCN   -Rapid GBS #MOF: Breast  #MOC: OP Nexplanon #Circ:  No  GME ATTESTATION:  I saw and evaluated the patient. I agree with the findings and the plan of care as documented in the resident's note. I have made changes to documentation as necessary.  Evalina Field, MD OB Fellow, Faculty [facility], Center for [facility] 01/09/2021 9:42 PM

## 2021-01-09 NOTE — MAU Note (Signed)
Pt reports headache that started today. Pt reports 1000 mg of tylenol. Pt reports some relief with the tylenol, but it did not help the ringing in her ear or how hot it is.   Pt reports her blood pressure at home was 160/110  Reports +FM   Denies vaginal bleeding or LOF.

## 2021-01-09 NOTE — MAU Note (Signed)
Phlebotomist at bedside drawing pt's blood.

## 2021-01-09 NOTE — MAU Provider Note (Addendum)
History     CSN: [id number]  Arrival date and time: 01/09/21 1752   Event Date/Time   First Provider Initiated Contact with Patient 01/09/21 1836      Chief Complaint  Patient presents with   Headache   Headache  Associated symptoms include ear pain and tinnitus. Pertinent negatives include no dizziness. Patient presenting with an 8/10 global headache that started at 5 AM this morning.  She took 1000 mg of Tylenol and it improved but never subsided.  It has returned to full strength at this time.  She checked her BP at home and it was 160/110.  She has had several similar headaches throughout her pregnancy, but they have always resolved with Tylenol. She also reports blurring of her vision and ringing in her ears. Denies any edema or RUQ pain.  She has had gestational hypertension throughout this pregnancy with BP to the 150s/90s, but has never had a documented severe range pressure.   OB History     Gravida  3   Para  2   Term  2   Preterm  0   AB  0   Living  2      SAB  0   IAB  0   Ectopic  0   Multiple  0   Live Births  2           Past Medical History:  Diagnosis Date   Medical history non-contributory    Pregnancy induced hypertension     Past Surgical History:  Procedure Laterality Date   CHOLECYSTECTOMY      Family History  Problem Relation Age of Onset   Hypertension Mother     Social History   Tobacco Use   Smoking status: Never   Smokeless tobacco: Never  Vaping Use   Vaping Use: Never used  Substance Use Topics   Alcohol use: No   Drug use: No    Allergies: No Known Allergies  Medications Prior to Admission  Medication Sig Dispense Refill Last Dose   aspirin 81 MG EC tablet Take 81 mg by mouth daily. Swallow whole.   01/09/2021   ferrous sulfate 325 (65 FE) MG tablet Take 1 tablet (325 mg total) by mouth every other day. 30 tablet 3 01/09/2021   Prenatal Vit-Fe Fumarate-FA (MULTIVITAMIN-PRENATAL) 27-0.8 MG TABS tablet Take 1  tablet by mouth daily at 12 noon.   01/08/2021   Magnesium 400 MG TABS Take 1 tablet by mouth daily as needed (headache). 30 tablet 1     Review of Systems  HENT:  Positive for ear pain and tinnitus.   Eyes:  Positive for visual disturbance.  Respiratory:  Positive for shortness of breath. Negative for chest tightness.   Cardiovascular:  Negative for leg swelling.  Neurological:  Positive for headaches. Negative for dizziness and tremors.  Physical Exam   Blood pressure (!) 158/102, pulse 77, temperature 97.6 F (36.4 C), resp. rate 12, last menstrual period 04/29/2020, SpO2 99 %, unknown if currently breastfeeding.  Physical Exam Constitutional:      Comments: Uncomfortable appearing  Eyes:     General: No visual field deficit. Cardiovascular:     Rate and Rhythm: Normal rate and regular rhythm.     Heart sounds: No murmur heard.   No friction rub. No gallop.  Pulmonary:     Effort: Pulmonary effort is normal.     Breath sounds: Normal breath sounds.  Abdominal:     Tenderness: There is no abdominal  tenderness.  Musculoskeletal:        General: No swelling or tenderness.  Neurological:     Mental Status: She is alert and oriented to person, place, and time.     Sensory: No sensory deficit.    NST NST Reactive Fetal Wellbeing 135/moderate/accelerations  MAU Course  Procedures  MDM Patient with BP on arrival to 158/102. BP in office one week prior 156/98. Pre-E labs obtained with normal platelets and Protein/Creatinine Ratio. Two severe range pressures that were treated with IV labetalol per protocol.     Gave HA cocktail of Reglan, Benadryl, Decadron with improvement in HA symptoms.   Also, of note, fetus was noted to be breech on both 8/17 and 8/31, however, presentation is now vertex per BSUS.   Assessment and Plan  #Gestational HTN now Pre E with Severe Features Severe range pressures requiring IV labetalol. Headache has improved with HA cocktail. GBS currently  unknown. - Admit to L&D for IOL for Pre-E w/ Severe Features - IV Magnesium - PRN IV labetalol per protocol  - Rapid GBS  Dorothyann Gibbs 01/09/2021, 6:41 PM

## 2021-01-10 ENCOUNTER — Other Ambulatory Visit: Payer: Self-pay

## 2021-01-10 ENCOUNTER — Encounter: Payer: Self-pay | Admitting: Family Medicine

## 2021-01-10 LAB — COMPREHENSIVE METABOLIC PANEL
ALT: 10 U/L (ref 0–44)
AST: 16 U/L (ref 15–41)
Albumin: 2.6 g/dL — ABNORMAL LOW (ref 3.5–5.0)
Alkaline Phosphatase: 125 U/L (ref 38–126)
Anion gap: 10 (ref 5–15)
BUN: <5 mg/dL — ABNORMAL LOW (ref 6–20)
CO2: 21 mmol/L — ABNORMAL LOW (ref 22–32)
Calcium: 7.1 mg/dL — ABNORMAL LOW (ref 8.9–10.3)
Chloride: 105 mmol/L (ref 98–111)
Creatinine, Ser: 0.55 mg/dL (ref 0.44–1.00)
GFR, Estimated: >60 mL/min (ref >60)
Glucose, Bld: 100 mg/dL — ABNORMAL HIGH (ref 70–99)
Potassium: 3.2 mmol/L — ABNORMAL LOW (ref 3.5–5.1)
Sodium: 136 mmol/L (ref 135–145)
Total Bilirubin: 0.4 mg/dL (ref 0.3–1.2)
Total Protein: 5.9 g/dL — ABNORMAL LOW (ref 6.5–8.1)

## 2021-01-10 LAB — CBC
HCT: 37 % (ref 36.0–46.0)
Hemoglobin: 12.5 g/dL (ref 12.0–15.0)
MCH: 27.2 pg (ref 26.0–34.0)
MCHC: 33.8 g/dL (ref 30.0–36.0)
MCV: 80.6 fL (ref 80.0–100.0)
Platelets: 275 10*3/uL (ref 150–400)
RBC: 4.59 MIL/uL (ref 3.87–5.11)
RDW: 18.4 % — ABNORMAL HIGH (ref 11.5–15.5)
WBC: 11.1 10*3/uL — ABNORMAL HIGH (ref 4.0–10.5)
nRBC: 0 % (ref 0.0–0.2)

## 2021-01-10 MED ORDER — LABETALOL HCL 200 MG PO TABS
200.0000 mg | ORAL_TABLET | Freq: Two times a day (BID) | ORAL | Status: DC
  Administered 2021-01-10 – 2021-01-11 (×2): 200 mg via ORAL
  Filled 2021-01-10 (×2): qty 1

## 2021-01-10 MED ORDER — BUTALBITAL-APAP-CAFFEINE 50-325-40 MG PO TABS
1.0000 | ORAL_TABLET | Freq: Once | ORAL | Status: AC
  Administered 2021-01-10: 1 via ORAL
  Filled 2021-01-10: qty 1

## 2021-01-10 NOTE — Progress Notes (Signed)
Cephalic confirmed via BSUS.   Evalina Field, MD

## 2021-01-10 NOTE — Progress Notes (Signed)
Holly Gutierrez is a 35 y.o. G3P2002 at [redacted]w[redacted]d admitted for induction of labor due to preeclampsia with severe features (BPs and occasional HAs).   Subjective: not feeling contractions; she denies HA, blurry vision, floating spots, RUQ pain at this time.   Objective: BP (!) 146/101   Pulse 69   Temp 98.6 F (37 C) (Oral)   Resp 16   Ht 5\' 6"  (1.676 m)   Wt 74.8 kg   LMP 04/29/2020   SpO2 97%   BMI 26.63 kg/m  Total I/O In: 1573.1 [P.O.:365; I.V.:1058.1; IV Piggyback:150] Out: 2075 [Urine:2075]  FHR baseline 120 bpm, Variability: moderate, Accelerations:present, Decelerations:  Absent Toco: q3-4   SVE:   Dilation: 1 Effacement (%): Thick Station: -3 Exam by:: [email or web address], RN  Pitocin @ 0 mu/min  Labs: Lab Results  Component Value Date   WBC 8.2 01/09/2021   HGB 12.1 01/09/2021   HCT 37.0 01/09/2021   MCV 82.6 01/09/2021   PLT 290 01/09/2021    Assessment / Plan: Early labor; attempted to place [facility] catheter with speculum  . Unable to advance cook with speculum or manually, patient felt very uncomfortable. Will keep giving cytotec.   -Discussed with [facility]. [facility] that patient will get PO labetalol now (200 mg BID) due to continual need for IV anti-hypertensives.   Labor: early Fetal Wellbeing:  Category I Pain Control:  labor support without medications Pre-eclampsia:    patient with pre-e with severe features due to BPs but no HA.  I/D:   GBS unknown; will treat Anticipated MOD: NSVB  Ashok Pall CNM, WHNP-BC 01/10/2021, 5:33 PM

## 2021-01-10 NOTE — Progress Notes (Signed)
Holly Gutierrez is a 35 y.o. G3P2002 at [redacted]w[redacted]d admitted for  IOL for pre-e with severe features  based on HA and new onset of severe range BPs.   Subjective: feeling contractions some, but not uncomfortable She denies blurry vision, floating spots, RUQ pain. She endorses a HA and was given meds for it.  Objective: BP (!) 174/97   Pulse 79   Temp 98.2 F (36.8 C) (Oral)   Resp 16   Ht 5\' 6"  (1.676 m)   Wt 74.8 kg   LMP 04/29/2020   SpO2 97%   BMI 26.63 kg/m  Total I/O In: 215 [P.O.:25; I.V.:190] Out: 400 [Urine:400]  FHR baseline 140 bpm, Variability: moderate, Accelerations:present, Decelerations:  Absent Toco: regular, every 1-3 minutes   SVE:   Dilation: Closed Effacement (%): 50 Station: -3 Exam by:: Ricki Vanhandel, CNM   Labs: Lab Results  Component Value Date   WBC 8.2 01/09/2021   HGB 12.1 01/09/2021   HCT 37.0 01/09/2021   MCV 82.6 01/09/2021   PLT 290 01/09/2021   -Reviewed pre-e labs from last night; all normal. Will recheck this morning.  -BP is elevated ; has required 2 doses of IV labetalol 20 mg overnight (at 7 pm and 6 am).  - Patient Vitals for the past 24 hrs:  BP Temp Temp src Pulse Resp SpO2 Height Weight  01/10/21 1001 (!) 174/97 -- -- 79 16 -- -- --  01/10/21 0901 (!) 149/100 -- -- 72 16 -- -- --  01/10/21 0801 (!) 146/102 -- -- 79 16 -- -- --  01/10/21 0731 (!) 158/103 -- -- 73 -- -- -- --  01/10/21 0700 (!) 151/98 -- -- 72 -- -- -- --  01/10/21 0637 (!) 156/97 -- -- 77 -- -- -- --  01/10/21 0619 (!) 164/98 -- -- 77 -- -- -- --  01/10/21 0601 (!) 161/102 -- -- 75 16 -- -- --  01/10/21 0501 (!) 152/95 -- -- 79 17 -- -- --  01/10/21 0405 (!) 154/91 -- -- 75 16 -- -- --  01/10/21 0301 (!) 150/99 -- -- 72 17 -- -- --  01/10/21 0200 (!) 142/96 -- -- 73 18 -- -- --  01/10/21 0100 (!) 157/93 -- -- 78 18 -- -- --  01/10/21 0000 (!) 153/96 -- -- 72 16 -- -- --  01/09/21 2331 (!) 147/91 -- -- 78 -- -- -- --  01/09/21 2301 (!) 148/93 -- -- 76 -- --  -- --  01/09/21 2231 (!) 146/91 -- -- 74 -- -- -- --  01/09/21 2216 (!) 138/93 -- -- 72 -- -- -- --  01/09/21 2205 -- -- -- -- -- 97 % -- --  01/09/21 2201 (!) 146/94 -- -- 72 -- -- -- --  01/09/21 2200 -- -- -- -- -- 97 % -- --  01/09/21 2155 -- -- -- -- -- 98 % -- --  01/09/21 2150 -- -- -- -- -- 98 % -- --  01/09/21 2146 (!) 156/96 -- -- 79 -- -- -- --  01/09/21 2145 -- -- -- -- -- 97 % -- --  01/09/21 2140 -- -- -- -- -- 97 % -- --  01/09/21 2135 -- -- -- -- -- 97 % -- --  01/09/21 2132 (!) 161/96 -- -- 77 -- -- -- --  01/09/21 2131 (!) 164/97 -- -- 79 -- -- -- --  01/09/21 2123 (!) 157/94 -- -- 79 -- -- -- --  01/09/21 2121 -- -- -- -- --  98 % -- --  01/09/21 2120 -- -- -- -- -- 98 % -- --  01/09/21 2116 -- -- -- -- -- 98 % -- --  01/09/21 2115 -- -- -- -- -- 98 % -- --  01/09/21 2105 -- -- -- -- -- 98 % -- --  01/09/21 2101 -- -- -- -- -- 98 % -- --  01/09/21 2100 -- -- -- -- -- 98 % -- --  01/09/21 2055 -- -- -- -- -- 98 % -- --  01/09/21 2050 -- -- -- -- -- 98 % -- --  01/09/21 2045 -- -- -- -- -- 98 % -- --  01/09/21 2044 (!) 158/95 98.2 F (36.8 C) Oral 73 18 97 % 5\' 6"  (1.676 m) 74.8 kg  01/09/21 2000 (!) 152/94 -- -- 79 -- -- -- --  01/09/21 1945 (!) 151/98 -- -- 68 -- -- -- --  01/09/21 1935 (!) 147/107 -- -- 71 -- -- -- --  01/09/21 1930 (!) 144/101 -- -- 83 -- -- -- --  01/09/21 1916 (!) 165/96 -- -- 73 -- -- -- --  01/09/21 1901 (!) 160/100 -- -- 69 -- -- -- --  01/09/21 1845 (!) 156/101 -- -- 73 -- 98 % -- --  01/09/21 1822 (!) 158/102 -- -- 77 -- -- -- --  01/09/21 1821 -- 97.6 F (36.4 C) -- -- 12 99 % -- --    Assessment / Plan: Patient still in early labor; cervix is soft but internal os is closed. Will give another dose of cytotec and attempt Cook catheter in 4 hours.  Patient is amenable to plan of care.  Labor: early Give another dose of buccal cyctoec Fetal Wellbeing:  Category I Pain Control:  labor support without medications Pre-eclampsia: bp's  stable and on mag. Will give medicine for HA; will redraw pre-e labs I/D:   GBS unknown, will treat Anticipated MOD: NSVB  03/11/21 CNM, WHNP-BC 01/10/2021, 10:42 AM

## 2021-01-10 NOTE — Progress Notes (Signed)
Labor Progress Note Holly Gutierrez is a 35 y.o. G3P2002 at [redacted]w[redacted]d presented for IOL due to pre-eclampsia with severe features.   S: Patient is complaining of a headache that she rates as an 8/10 in severity. She has no other complaints at this time. FOB at bedside.  O:  BP (!) 157/93   Pulse 78   Temp 98.2 F (36.8 C) (Oral)   Resp 18   Ht 5\' 6"  (1.676 m)   Wt 74.8 kg   LMP 04/29/2020   SpO2 97%   BMI 26.63 kg/m  EFM: 125 bpm/moderate variability/+accels/no decels  CVE: Dilation: Closed Effacement (%): 50 Cervical Position: Posterior Station: -3 Presentation: Vertex Exam by:: Dr. [email or web address]   [name]: 35 y.o. 32 [redacted]w[redacted]d   #Labor: Patient remains closed on SVE. Contracting well with Cytotec. Will plan to repeat buccal Cytotec once contractions space. Will confirm presentation via BSUS prior to next dose.  #Pain: PRN - planning for epidural  #FWB: Cat 1  #GBS: Unknown - PCN. GBS culture pending. #Pre-eclampsia with severe features: Patient reports headache 8/10. Will give Fioricet. BP remains in mild range though has had a couple that are borderline severe. On Mag. BP protocol in place. Pre-E labs on admission normal. Will continue to monitor closely.  [redacted]w[redacted]d, MD 2:33 AM

## 2021-01-11 ENCOUNTER — Encounter (HOSPITAL_COMMUNITY): Payer: Self-pay | Admitting: Obstetrics & Gynecology

## 2021-01-11 DIAGNOSIS — O1414 Severe pre-eclampsia complicating childbirth: Secondary | ICD-10-CM

## 2021-01-11 DIAGNOSIS — Z3A36 36 weeks gestation of pregnancy: Secondary | ICD-10-CM

## 2021-01-11 DIAGNOSIS — O134 Gestational [pregnancy-induced] hypertension without significant proteinuria, complicating childbirth: Secondary | ICD-10-CM

## 2021-01-11 LAB — COMPREHENSIVE METABOLIC PANEL
ALT: 11 U/L (ref 0–44)
AST: 17 U/L (ref 15–41)
Albumin: 2.6 g/dL — ABNORMAL LOW (ref 3.5–5.0)
Alkaline Phosphatase: 108 U/L (ref 38–126)
Anion gap: 7 (ref 5–15)
BUN: 5 mg/dL — ABNORMAL LOW (ref 6–20)
CO2: 21 mmol/L — ABNORMAL LOW (ref 22–32)
Calcium: 6.9 mg/dL — ABNORMAL LOW (ref 8.9–10.3)
Chloride: 107 mmol/L (ref 98–111)
Creatinine, Ser: 0.5 mg/dL (ref 0.44–1.00)
GFR, Estimated: >60 mL/min (ref >60)
Glucose, Bld: 85 mg/dL (ref 70–99)
Potassium: 3.5 mmol/L (ref 3.5–5.1)
Sodium: 135 mmol/L (ref 135–145)
Total Bilirubin: 0.6 mg/dL (ref 0.3–1.2)
Total Protein: 6.1 g/dL — ABNORMAL LOW (ref 6.5–8.1)

## 2021-01-11 LAB — CBC
HCT: 36.8 % (ref 36.0–46.0)
HCT: 37.5 % (ref 36.0–46.0)
Hemoglobin: 12.2 g/dL (ref 12.0–15.0)
Hemoglobin: 12.4 g/dL (ref 12.0–15.0)
MCH: 26.9 pg (ref 26.0–34.0)
MCH: 26.9 pg (ref 26.0–34.0)
MCHC: 33.2 g/dL (ref 30.0–36.0)
MCHC: 33.1 g/dL (ref 30.0–36.0)
MCV: 81.2 fL (ref 80.0–100.0)
MCV: 81.3 fL (ref 80.0–100.0)
Platelets: 294 10*3/uL (ref 150–400)
Platelets: 282 10*3/uL (ref 150–400)
RBC: 4.53 MIL/uL (ref 3.87–5.11)
RBC: 4.61 MIL/uL (ref 3.87–5.11)
RDW: 18.6 % — ABNORMAL HIGH (ref 11.5–15.5)
RDW: 18.6 % — ABNORMAL HIGH (ref 11.5–15.5)
WBC: 8.1 10*3/uL (ref 4.0–10.5)
WBC: 9.6 10*3/uL (ref 4.0–10.5)
nRBC: 0 % (ref 0.0–0.2)
nRBC: 0 % (ref 0.0–0.2)

## 2021-01-11 LAB — MAGNESIUM: Magnesium: 5.2 mg/dL — ABNORMAL HIGH (ref 1.7–2.4)

## 2021-01-11 MED ORDER — MISOPROSTOL 200 MCG PO TABS
ORAL_TABLET | ORAL | Status: AC
  Administered 2021-01-11: 1000 ug via RECTAL
  Filled 2021-01-11: qty 5

## 2021-01-11 MED ORDER — LABETALOL HCL 200 MG PO TABS
300.0000 mg | ORAL_TABLET | Freq: Three times a day (TID) | ORAL | Status: DC
  Administered 2021-01-11 (×2): 300 mg via ORAL
  Filled 2021-01-11 (×2): qty 1

## 2021-01-11 MED ORDER — TRANEXAMIC ACID-NACL 1000-0.7 MG/100ML-% IV SOLN
1000.0000 mg | Freq: Once | INTRAVENOUS | Status: AC
  Administered 2021-01-11: 1000 mg via INTRAVENOUS

## 2021-01-11 MED ORDER — OXYTOCIN-SODIUM CHLORIDE 30-0.9 UT/500ML-% IV SOLN
1.0000 m[IU]/min | INTRAVENOUS | Status: DC
  Administered 2021-01-11: 2 m[IU]/min via INTRAVENOUS

## 2021-01-11 MED ORDER — BUTALBITAL-APAP-CAFFEINE 50-325-40 MG PO TABS
1.0000 | ORAL_TABLET | Freq: Four times a day (QID) | ORAL | Status: DC | PRN
  Administered 2021-01-11: 1 via ORAL
  Filled 2021-01-11: qty 1

## 2021-01-11 MED ORDER — LABETALOL HCL 200 MG PO TABS: 300.0000 mg | ORAL_TABLET | Freq: Two times a day (BID) | ORAL | Status: DC

## 2021-01-11 MED ORDER — TERBUTALINE SULFATE 1 MG/ML IJ SOLN: 0.2500 mg | Freq: Once | INTRAMUSCULAR | Status: DC | PRN

## 2021-01-11 MED ORDER — TRANEXAMIC ACID-NACL 1000-0.7 MG/100ML-% IV SOLN
INTRAVENOUS | Status: AC
  Filled 2021-01-11: qty 100

## 2021-01-11 MED ORDER — MISOPROSTOL 200 MCG PO TABS: 1000.0000 ug | ORAL_TABLET | Freq: Once | ORAL | Status: AC

## 2021-01-11 NOTE — Progress Notes (Addendum)
Holly Gutierrez is a 35 y.o. G3P2002 at [redacted]w[redacted]d admitted for induction of labor due to severe pre-eclampsia  Subjective: Pt continues to have normal contractions. States that her headaches have returned and are causing discomfort. No N/V, dizziness.   Objective: BP (!) 154/94   Pulse 76   Temp 98.6 F (37 C)   Resp 16   Ht 5\' 6"  (1.676 m)   Wt 165 lb (74.8 kg)   LMP 04/29/2020   SpO2 97%   BMI 26.63 kg/m  I/O last 3 completed shifts: In: 5340.4 [P.O.:1010; I.V.:3738.7; IV Piggyback:591.7] Out: 7375 [Urine:7375] Total I/O In: 631.4 [P.O.:100; I.V.:531.4] Out: 1000 [Urine:1000]  FHT:  FHR: 120 bpm, variability: moderate,  accelerations:  Present,  decelerations:  Absent UC:   regular, every 1-2 minutes SVE:   Dilation: 5 Effacement (%): 50 Station: -2 Exam by:: victor cresenzo  Labs: Lab Results  Component Value Date   WBC 8.1 01/11/2021   HGB 12.2 01/11/2021   HCT 36.8 01/11/2021   MCV 81.2 01/11/2021   PLT 294 01/11/2021    Assessment / Plan: Induction of labor due to preeclampsia,  cooks came out and progressing on pitocin.   Labor: cooks came out, On Pitocin,  cervix progressed to 5cm, AROMed during this check Preeclampsia:  On magnesium sulfate, increase PO Labetalol 200mg  BID to 300mg  TID - Fioricet q6hr for headache Fetal Wellbeing:  Category I Pain Control:  Epidural I/D:   GBS negative Anticipated MOD:  NSVD  03/13/2021 MS3  OB Fellow 01/11/2021, 5:15 PM

## 2021-01-11 NOTE — Progress Notes (Addendum)
Holly Gutierrez is a 35 y.o. G3P2002 at [redacted]w[redacted]d admitted for induction of labor due to severe pre-eclampsia.  Subjective: Feels ok, no headaches at this time.   Objective: BP (!) 150/90   Pulse 70   Temp 98.6 F (37 C)   Resp 16   Ht 5\' 6"  (1.676 m)   Wt 74.8 kg   LMP 04/29/2020   SpO2 97%   BMI 26.63 kg/m  I/O last 3 completed shifts: In: 5340.4 [P.O.:1010; I.V.:3738.7; IV Piggyback:591.7] Out: 7375 [Urine:7375] Total I/O In: 158.3 [I.V.:158.3] Out: -   FHT:  FHR: 120 bpm, variability: moderate,  accelerations:  Present,  decelerations:  Absent UC:   regular, every 1-2 minutes SVE:   Dilation: 2 Effacement (%): 50 Station: -3 Exam by:: Dr. [email or web address]  Labs: Lab Results  Component Value Date   WBC 8.1 01/11/2021   HGB 12.2 01/11/2021   HCT 36.8 01/11/2021   MCV 81.2 01/11/2021   PLT 294 01/11/2021    Assessment / Plan: Induction of labor due to severe pre-eclampsia, has cooks in place and on pitocin  Labor:  cooks in place and on pitocin, s/p cytotec x5 Preeclampsia:  on magnesium sulfate, BP elevated but not severe range at this time. Last needed IV medication ~0700. Increase PO Labetalol 200mg  BID to 300mg  BID. Fetal Wellbeing:  Category I Pain Control:   Planning epidural I/D:   GBS negative Anticipated MOD:  NSVD  05-23-1971, MD, MPH OB Fellow, [facility]

## 2021-01-11 NOTE — Discharge Summary (Addendum)
Please see revised DC summary

## 2021-01-11 NOTE — Lactation Note (Signed)
This note was copied from a baby's chart. Lactation Consultation Note  Patient Name: Holly Gutierrez Today's Date: 01/11/2021 Age:34 hours  LC contacted [name] to check if mother desires LC in L&D. LC was informed: "it is not a good time".  LC will visit couplet at another time as possible.   [name] 01/11/2021, 10:11 PM

## 2021-01-11 NOTE — Progress Notes (Signed)
Labor Progress Note Holly Gutierrez is a 35 y.o. G3P2002 at [redacted]w[redacted]d presented for IOL due to pre-eclampsia with severe features.   S: Doing well. Resting comfortably. No concerns at this time.  O:  BP (!) 141/94   Pulse 76   Temp 98.6 F (37 C) (Oral)   Resp 17   Ht 5\' 6"  (1.676 m)   Wt 74.8 kg   LMP 04/29/2020   SpO2 97%   BMI 26.63 kg/m  EFM: Baseline 115 bpm/moderate variability/+accels/-decels   CVE: Dilation: 1.5 Effacement (%): 40 Cervical Position: Posterior Station: -3 Presentation: Vertex Exam by:: Dr. [email or web address]   [name]: 35 y.o. 32 [redacted]w[redacted]d  #Labor: Small change since last exam. More bloody show. Attempted Cook catheter placement however uterine balloon continued to dislodge with inflation. Given small amount of vaginal bleeding encountered, placement deferred. Will repeat Cytotec and reassess in 3-4 hours. #Pain: PRN #FWB: Cat 1  #GBS unknown; culture pending. Continue PCN.  #Pre-eclampsia: On Mag. BP normal to mild range without symptoms. Continue Labetalol 200 mg BID. Repeat labs ordered for this AM with Mg level.   [redacted]w[redacted]d, MD 2:02 AM

## 2021-01-11 NOTE — Consult Note (Signed)
  Delivery team called to the room for vaginal delivery.  Delivery team arrived and dismissed by L&D staff since service is not needed.    Overton Mam, MD (Attending Neonatologist)

## 2021-01-11 NOTE — Progress Notes (Signed)
Labor Progress Note Dafna Romo is a 35 y.o. G3P2002 at [redacted]w[redacted]d who presented for IOL due to pre-eclampsia with severe features.   S: Patient is doing well. Resting comfortably. FOB at bedside.   O:  BP (!) 169/98   Pulse 65   Temp 98.6 F (37 C)   Resp 18   Ht 5\' 6"  (1.676 m)   Wt 74.8 kg   LMP 04/29/2020   SpO2 97%   BMI 26.63 kg/m   EFM: Baseline 120 bpm/moderate variability/+accels/-decels   CVE: Dilation: 2 Effacement (%): 50 Cervical Position: Posterior Station: -3 Presentation: Vertex Exam by:: Dr. [email or web address]   [name]: 35 y.o. 32 [redacted]w[redacted]d   #Labor: Unchanged since last exam. Cook's catheter placed with 80 cc in uterine balloon. Mom and baby tolerated placement well. Will start Pitocin 2x2 and reassess in 3-4 hours.  #Pain: PRN #FWB: Cat 1 #GBS unknown; culture pending. Continue PCN.  #Pre-eclampsia: On Mag. BP normal to mild range without symptoms. Continue Labetalol 200 mg BID. Follow up AM CBC, CMP, and Mg  (in process).   [redacted]w[redacted]d, MD 7:14 AM

## 2021-01-12 ENCOUNTER — Other Ambulatory Visit (HOSPITAL_COMMUNITY): Payer: Self-pay

## 2021-01-12 LAB — CULTURE, BETA STREP (GROUP B ONLY)

## 2021-01-12 LAB — CBC
HCT: 33.5 % — ABNORMAL LOW (ref 36.0–46.0)
Hemoglobin: 11 g/dL — ABNORMAL LOW (ref 12.0–15.0)
MCH: 27 pg (ref 26.0–34.0)
MCHC: 32.8 g/dL (ref 30.0–36.0)
MCV: 82.1 fL (ref 80.0–100.0)
Platelets: 266 10*3/uL (ref 150–400)
RBC: 4.08 MIL/uL (ref 3.87–5.11)
RDW: 18.2 % — ABNORMAL HIGH (ref 11.5–15.5)
WBC: 12.2 10*3/uL — ABNORMAL HIGH (ref 4.0–10.5)
nRBC: 0 % (ref 0.0–0.2)

## 2021-01-12 LAB — MAGNESIUM: Magnesium: 4.8 mg/dL — ABNORMAL HIGH (ref 1.7–2.4)

## 2021-01-12 MED ORDER — IBUPROFEN 600 MG PO TABS
600.0000 mg | ORAL_TABLET | Freq: Four times a day (QID) | ORAL | Status: DC
  Administered 2021-01-12 – 2021-01-13 (×7): 600 mg via ORAL
  Filled 2021-01-12 (×7): qty 1

## 2021-01-12 MED ORDER — METHYLERGONOVINE MALEATE 0.2 MG PO TABS: 0.2000 mg | ORAL_TABLET | ORAL | Status: DC | PRN

## 2021-01-12 MED ORDER — LACTATED RINGERS IV SOLN: INTRAVENOUS | Status: DC

## 2021-01-12 MED ORDER — NIFEDIPINE ER OSMOTIC RELEASE 30 MG PO TB24
30.0000 mg | ORAL_TABLET | Freq: Every day | ORAL | Status: DC
  Administered 2021-01-12 – 2021-01-13 (×2): 30 mg via ORAL
  Filled 2021-01-12 (×2): qty 1

## 2021-01-12 MED ORDER — IBUPROFEN 600 MG PO TABS
600.0000 mg | ORAL_TABLET | Freq: Four times a day (QID) | ORAL | 0 refills | Status: DC
  Filled 2021-01-12: qty 30, 8d supply, fill #0

## 2021-01-12 MED ORDER — MEDROXYPROGESTERONE ACETATE 150 MG/ML IM SUSP
150.0000 mg | INTRAMUSCULAR | Status: DC | PRN
Start: 2021-01-12 — End: 2021-01-13

## 2021-01-12 MED ORDER — NIFEDIPINE ER OSMOTIC RELEASE 30 MG PO TB24: 30.0000 mg | ORAL_TABLET | Freq: Every day | ORAL | Status: DC

## 2021-01-12 MED ORDER — NIFEDIPINE ER 30 MG PO TB24
30.0000 mg | ORAL_TABLET | Freq: Every day | ORAL | 1 refills | Status: DC
  Filled 2021-01-12: qty 30, 30d supply, fill #0

## 2021-01-12 MED ORDER — MEASLES, MUMPS & RUBELLA VAC IJ SOLR: 0.5000 mL | Freq: Once | INTRAMUSCULAR | Status: DC

## 2021-01-12 MED ORDER — PRENATAL MULTIVITAMIN CH: 1.0000 | ORAL_TABLET | Freq: Every day | ORAL | Status: DC

## 2021-01-12 MED ORDER — FLEET ENEMA 7-19 GM/118ML RE ENEM: 1.0000 | ENEMA | Freq: Every day | RECTAL | Status: DC | PRN

## 2021-01-12 MED ORDER — SIMETHICONE 80 MG PO CHEW: 80.0000 mg | CHEWABLE_TABLET | ORAL | Status: DC | PRN

## 2021-01-12 MED ORDER — ACETAMINOPHEN 325 MG PO TABS: 650.0000 mg | ORAL_TABLET | ORAL | Status: DC | PRN

## 2021-01-12 MED ORDER — DOCUSATE SODIUM 100 MG PO CAPS
100.0000 mg | ORAL_CAPSULE | Freq: Two times a day (BID) | ORAL | Status: DC
  Administered 2021-01-12 – 2021-01-13 (×4): 100 mg via ORAL
  Filled 2021-01-12 (×4): qty 1

## 2021-01-12 MED ORDER — BISACODYL 10 MG RE SUPP: 10.0000 mg | Freq: Every day | RECTAL | Status: DC | PRN

## 2021-01-12 MED ORDER — DIBUCAINE (PERIANAL) 1 % EX OINT: 1.0000 "application " | TOPICAL_OINTMENT | CUTANEOUS | Status: DC | PRN

## 2021-01-12 MED ORDER — COCONUT OIL OIL: 1.0000 "application " | TOPICAL_OIL | Status: DC | PRN

## 2021-01-12 MED ORDER — METHYLERGONOVINE MALEATE 0.2 MG/ML IJ SOLN: 0.2000 mg | INTRAMUSCULAR | Status: DC | PRN

## 2021-01-12 MED ORDER — WITCH HAZEL-GLYCERIN EX PADS: 1.0000 "application " | MEDICATED_PAD | CUTANEOUS | Status: DC | PRN

## 2021-01-12 MED ORDER — PRENATAL MULTIVITAMIN CH
1.0000 | ORAL_TABLET | Freq: Every day | ORAL | Status: DC
  Administered 2021-01-12 – 2021-01-13 (×2): 1 via ORAL
  Filled 2021-01-12 (×2): qty 1

## 2021-01-12 MED ORDER — DIPHENHYDRAMINE HCL 25 MG PO CAPS: 25.0000 mg | ORAL_CAPSULE | Freq: Four times a day (QID) | ORAL | Status: DC | PRN

## 2021-01-12 MED ORDER — FERROUS SULFATE 325 (65 FE) MG PO TABS
325.0000 mg | ORAL_TABLET | ORAL | Status: DC
  Administered 2021-01-12: 325 mg via ORAL
  Filled 2021-01-12: qty 1

## 2021-01-12 MED ORDER — ONDANSETRON HCL 4 MG/2ML IJ SOLN: 4.0000 mg | INTRAMUSCULAR | Status: DC | PRN

## 2021-01-12 MED ORDER — BENZOCAINE-MENTHOL 20-0.5 % EX AERO
1.0000 "application " | INHALATION_SPRAY | CUTANEOUS | Status: DC | PRN
  Filled 2021-01-12: qty 56

## 2021-01-12 MED ORDER — TETANUS-DIPHTH-ACELL PERTUSSIS 5-2.5-18.5 LF-MCG/0.5 IM SUSY: 0.5000 mL | PREFILLED_SYRINGE | Freq: Once | INTRAMUSCULAR | Status: DC

## 2021-01-12 MED ORDER — ONDANSETRON HCL 4 MG PO TABS: 4.0000 mg | ORAL_TABLET | ORAL | Status: DC | PRN

## 2021-01-12 NOTE — Progress Notes (Signed)
   01/12/21 0106  Vital Signs  BP 112/71  BP Location Left Arm  Patient Position (if appropriate) Lying  BP Method Automatic  Pulse Rate 65  Resp 16  Temp 97.8 F (36.6 C)  Temp Source Oral  Complaints & Interventions  Complains of Other (Comment) (lightheaded)  POSS Scale ([place]ero Opioid Sedation Scale)  POSS *See Group Information* S-Acceptable,Sleep, easy to arouse  Oxygen Therapy  SpO2 96 %  O2 Device Room Air  Pt's husband called out requesting for assistance. Found FOB assisting pt to bathroom on arrival. Pt pale and c/o of lightheadedness and urge to have a BM. Assisted pt to BM. Pericare done after BM. On helping pt back to bed, pt became lightheaded, laid pt on BR floor and called for assistance. Other RNs in room and assisted pt back to bed. Pt's VS as above. Report given to pt's RN, Chi Morris.

## 2021-01-12 NOTE — Lactation Note (Addendum)
This note was copied from a baby's chart. Lactation Consultation Note  Patient Name: Holly Gutierrez [id number] Date: 01/12/2021 Reason for consult: Initial assessment;Mother's request;Difficult latch;Late-preterm 34-36.6wks;Other (Comment) (GHTN) Age:34 hours  LC called for Spanish translator Nettie Elm but she did not arrive. Mom consented to use of relative to serve as Nurse, learning disability.  LPTI guidelines to reduce calorie loss reviewed.   LC assisted Mom with latching infant with increase in depth of swallow with breast compression.  LC went over importance of pumping consistently with DEBP to maintain her milk supply.   Plan 1. To feed based on cues 8-12x 24 hr period  2. Mom to offer breast first and look for signs of milk transfer 3. Mom to supplement with pace bottle feeding with yellow slow flow nipple LPTI guidelines keeping total feeding under 30 min. Mom to offer eBm first followed by formula 4. Mom aware to offer more if infant not latching.  5 Mom to pump with dEBP q 3hrs for 15 min.   Maternal Data Has patient been taught Hand Expression?: Yes Does the patient have breastfeeding experience prior to this delivery?: Yes How long did the patient breastfeed?: 1 month  Feeding Mother's Current Feeding Choice: Breast Milk and Formula  LATCH Score Latch: Repeated attempts needed to sustain latch, nipple held in mouth throughout feeding, stimulation needed to elicit sucking reflex.  Audible Swallowing: Spontaneous and intermittent  Type of Nipple: Everted at rest and after stimulation  Comfort (Breast/Nipple): Soft / non-tender  Hold (Positioning): Assistance needed to correctly position infant at breast and maintain latch.  LATCH Score: 8   Lactation Tools Discussed/Used Tools: Flanges;Pump Flange Size: 27 Breast pump type: Double-Electric Breast Pump Pump Education: Setup, frequency, and cleaning;Milk Storage Reason for Pumping: increase stimulation Pumping frequency:  every 3 hrs for 15 min  Interventions Interventions: Breast feeding basics reviewed;Breast compression;Assisted with latch;Adjust position;Skin to skin;Support pillows;DEBP;Breast massage;Position options;Hand express;Expressed milk;Education;Pace feeding;Coconut oil  Discharge Pump: Manual WIC Program: Yes  Consult Status Consult Status: Follow-up Date: 01/13/21 Follow-up type: In-patient    Holly Laurel  Gutierrez 01/12/2021, 3:46 PM

## 2021-01-12 NOTE — Progress Notes (Signed)
Post Partum Day 1 Subjective: no complaints, voiding, tolerating PO, and + flatus. She has some flushing and other side effects from the Magnesium but overall is doing well. She denies HA. Notes minimal bleeding.   Objective: Blood pressure (!) 149/94, pulse 72, temperature 97.6 F (36.4 C), temperature source Oral, resp. rate 16, height 5\' 6"  (1.676 m), weight 74.8 kg, last menstrual period 04/29/2020, SpO2 100 %, unknown if currently breastfeeding.  Physical Exam:  General: alert and no distress Lochia: appropriate Uterine Fundus: firm Incision: n/a Lungs: CTAB Heart: RRR DVT Evaluation: No cords or calf tenderness. No significant calf/ankle edema.  Recent Labs    01/11/21 1721 01/12/21 0206  HGB 12.4 11.0*  HCT 37.5 33.5*    Assessment/Plan: Severe PreE - by BP - Bps initially normal but now starting to increase. Will start Procardia 30 daily.  - Continue Magnesium for 24 hours - PreE labs on admission were normal  Language Barrier - Speaks Spanish - Interpreter used throughout our interview  Routine postpartum - Plan for discharge tomorrow assuming blood pressures well controlled - Breastfeeding, Lactation consult - Baby boy doing well - does not want circumcision - Contraception: Plan is for OP Nexplanon - Rh positive, Rubella immune   LOS: 3 days   03/14/21 01/12/2021, 12:07 PM

## 2021-01-12 NOTE — Progress Notes (Signed)
Pt has not been able to pump due to side effects of magnesium. Family has been helping assist with bottle feeding. Donnie Aho RN

## 2021-01-12 NOTE — Progress Notes (Signed)
Video Interpreter Holly Gutierrez) contacted to provide instructions and demonstration for DEBP and breastfeeding techniques. Pt and partner voiced understanding. Infant has breastfed appropriately x3 with some assistance needed. Mother has requested formula for supplementation. Education provided and pt voiced understanding.

## 2021-01-13 ENCOUNTER — Inpatient Hospital Stay (HOSPITAL_COMMUNITY): Payer: Self-pay

## 2021-01-13 ENCOUNTER — Inpatient Hospital Stay (HOSPITAL_COMMUNITY): Admission: AD | Admit: 2021-01-13 | Payer: Self-pay | Source: Home / Self Care | Admitting: Family Medicine

## 2021-01-13 LAB — RPR: RPR Ser Ql: NONREACTIVE

## 2021-01-13 NOTE — Progress Notes (Signed)
Pt stated she has been dumping out urine due to frequent  loose stools. Pt verbalized she voided approx. 1 hour ago. Pt educated on the need to measure urine output and voiced understanding. Will continue to monitor.

## 2021-01-13 NOTE — Progress Notes (Signed)
Discharge instructions given to patient via in person spanish interpreter. All questions answered. IV discontinued. Pt to room in with infant son as he will remain a patient.

## 2021-01-15 ENCOUNTER — Telehealth: Payer: Self-pay | Admitting: Obstetrics and Gynecology

## 2021-01-15 ENCOUNTER — Encounter: Payer: Self-pay | Admitting: Family Medicine

## 2021-01-18 ENCOUNTER — Ambulatory Visit: Payer: Self-pay

## 2021-01-22 ENCOUNTER — Ambulatory Visit (INDEPENDENT_AMBULATORY_CARE_PROVIDER_SITE_OTHER): Payer: Self-pay

## 2021-01-22 ENCOUNTER — Ambulatory Visit: Payer: Self-pay

## 2021-01-22 VITALS — BP 145/77 | HR 76 | Wt 154.8 lb

## 2021-01-22 DIAGNOSIS — O133 Gestational [pregnancy-induced] hypertension without significant proteinuria, third trimester: Secondary | ICD-10-CM

## 2021-01-22 NOTE — Progress Notes (Deleted)
Call placed to pt for missed appt. Pt left VM to return call to office to reschedule appt.  Judeth Cornfield, RN

## 2021-01-22 NOTE — Progress Notes (Signed)
Pt here today for BP check. Pt had NVSD on 01/11/21. Pt had GHTN with this pregnancy. Has hx of Pre-E. Spoke with pt with Raquel as interpreter. Pt states stopped taking BP meds since 9/14 due to feeling dizzy and having headaches. Denies any swelling or visual changes or HA since not taking BP med. Pt was taking Procardia 30mg .   BP: 145/77 today. Pt refused taking any or new Rx of BP med at this time.  Pt advised to continue to monitor and if symptoms worsen, then call and be advised of new plan of care. Pt verbalized understanding.   , RN

## 2021-01-23 NOTE — Progress Notes (Signed)
Patient seen and assessed by nursing staff.  Agree with documentation and plan.  

## 2021-01-24 ENCOUNTER — Telehealth (HOSPITAL_COMMUNITY): Payer: Self-pay

## 2021-01-24 NOTE — Telephone Encounter (Signed)
No answer. Left message to return nurse call.  Marcelino Duster [facility] 01/24/2021,1940

## 2021-01-25 ENCOUNTER — Encounter: Payer: Self-pay | Admitting: General Practice

## 2021-02-12 ENCOUNTER — Ambulatory Visit: Payer: Self-pay | Admitting: Obstetrics and Gynecology

## 2021-02-14 NOTE — Discharge Summary (Signed)
Physician Discharge Summary  Patient ID: Holly Gutierrez MRN: [id number] DOB/AGE: 1985/08/16 35 y.o.  Admit date: 01/09/2021 Discharge date: 01/13/21  Admission Diagnoses: Pre eclampsia with severe features, BP Discharge Diagnoses:  Active Problems:   Supervision of high risk pregnancy, antepartum   Gestational hypertension   LGA (large for gestational age) fetus affecting management of mother   Discharged Condition: good  Hospital Course: 2 day cervical ripening and OL with NSVD 01/11/21 Intra partum and postpartum magnesium sulfate Initiation of procardia xl 30 with good control Discharge PPD #2  Consults:   Significant Diagnostic Studies:   Treatments: IOL, magnesium therapy  Discharge Exam: Blood pressure (!) 144/85, pulse 64, temperature 98 F (36.7 C), temperature source Oral, resp. rate 18, height 5\' 6"  (1.676 m), weight 74.8 kg, last menstrual period 04/29/2020, SpO2 99 %, unknown if currently breastfeeding. General appearance: alert, cooperative, and no distress GI: soft, non-tender; bowel sounds normal; no masses,  no organomegaly  Disposition: Discharge disposition: 01-Home or Self Care       Discharge Instructions     Diet - low sodium heart healthy   Complete by: As directed       Allergies as of 01/13/2021   No Known Allergies      Medication List     STOP taking these medications    aspirin 81 MG EC tablet   ferrous sulfate 325 (65 FE) MG tablet   Magnesium 400 MG Tabs       TAKE these medications    ibuprofen 600 MG tablet Commonly known as: ADVIL Take 1 tablet (600 mg total) by mouth every 6 (six) hours.   NIFEdipine 30 MG 24 hr tablet Commonly known as: ADALAT CC Tome 1 tableta (30 mg en total) por va oral diariamente. (Take 1 tablet (30 mg total) by mouth daily.)   prenatal multivitamin Tabs tablet Take 1 tablet by mouth daily. What changed:  medication strength when to take this        Follow-up Information      [facility] Follow up in 1 week(s).   Specialty: Obstetrics and Gynecology Why: BP check Contact information: [address] [place] [phone]                Signed: [phone] 02/14/2021, 7:27 AM

## 2022-06-10 IMAGING — US US FETAL BPP W/ NON-STRESS
1 series · 14 of 14 positions shown · non-contrast
Comparison: none

[Series 1: us fetal bpp w/ non-stress · 14 acquisitions, 14 frames shown]
[im 1/14]
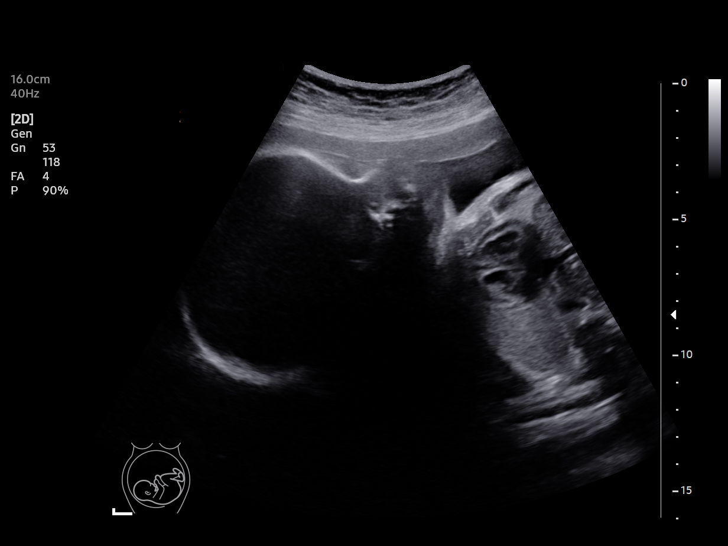
[im 2/14]
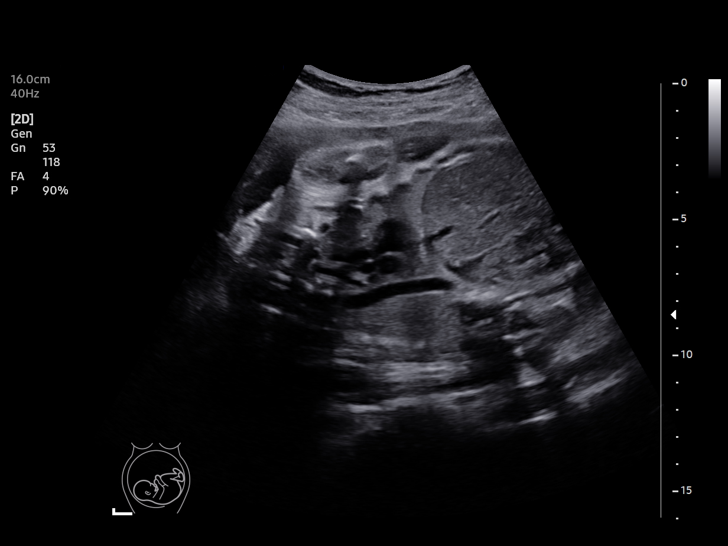
[im 3/14]
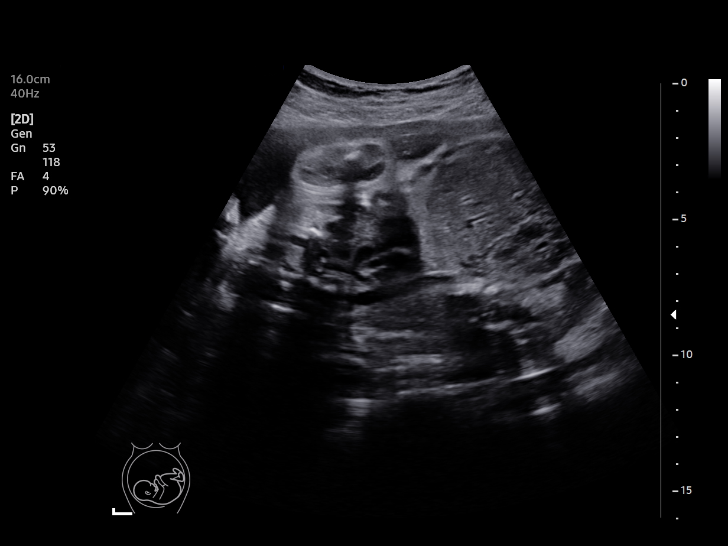
[im 4/14]
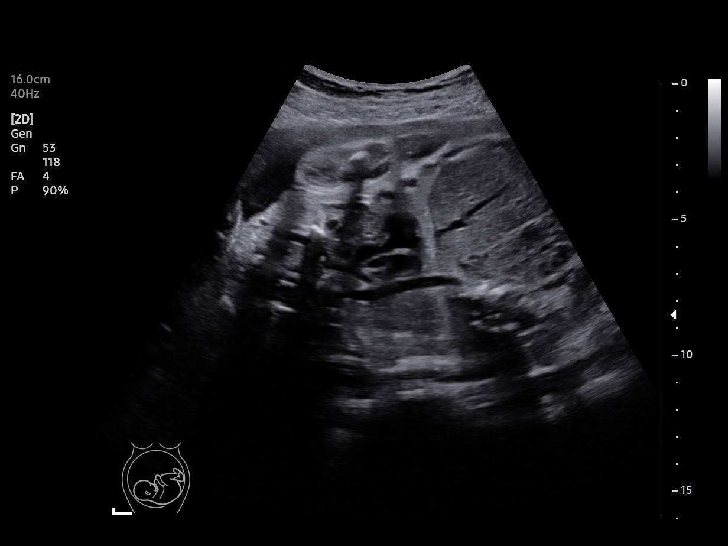
[im 5/14]
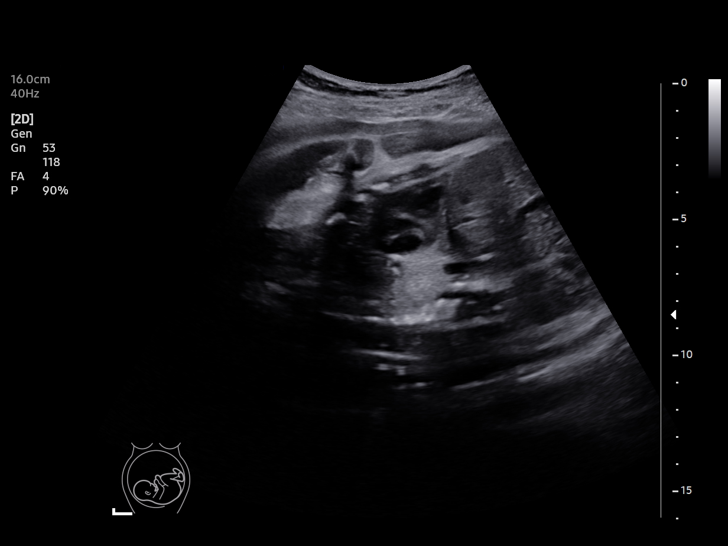
[im 6/14]
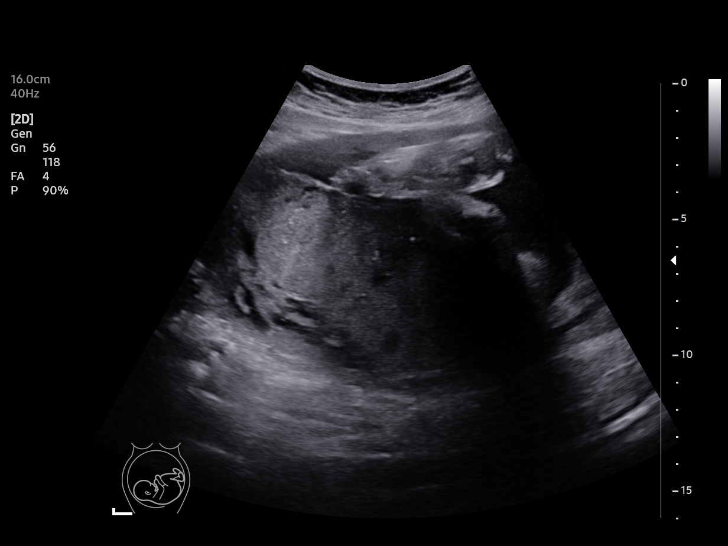
[im 7/14]
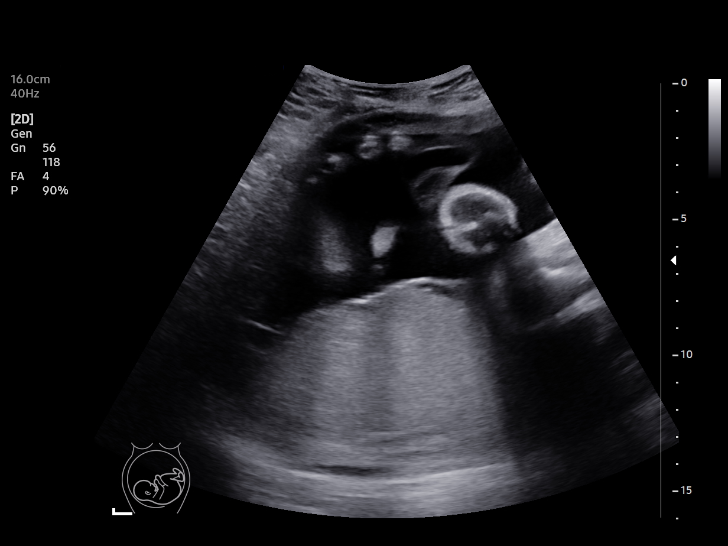
[im 8/14]
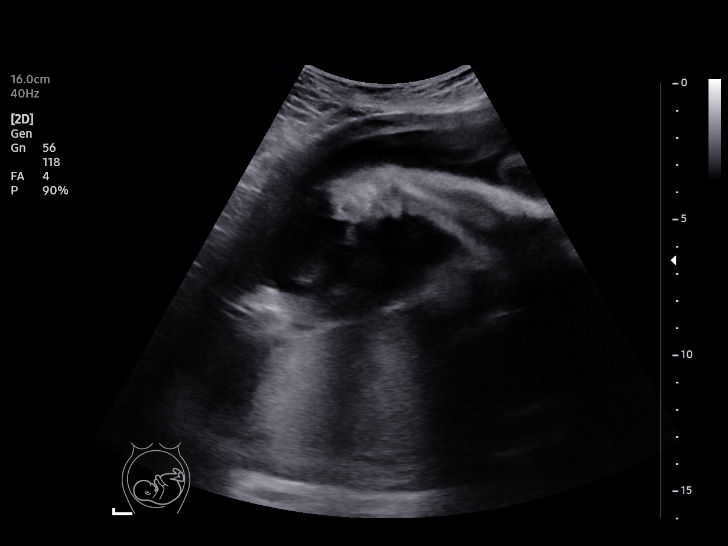
[im 9/14]
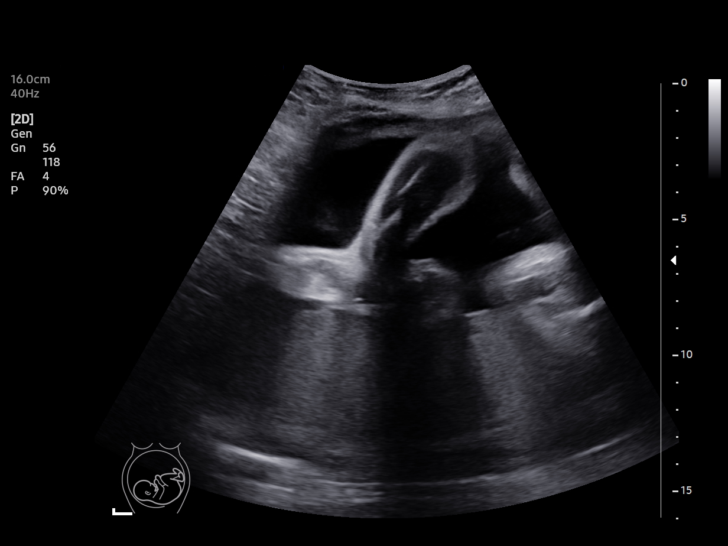
[im 10/14]
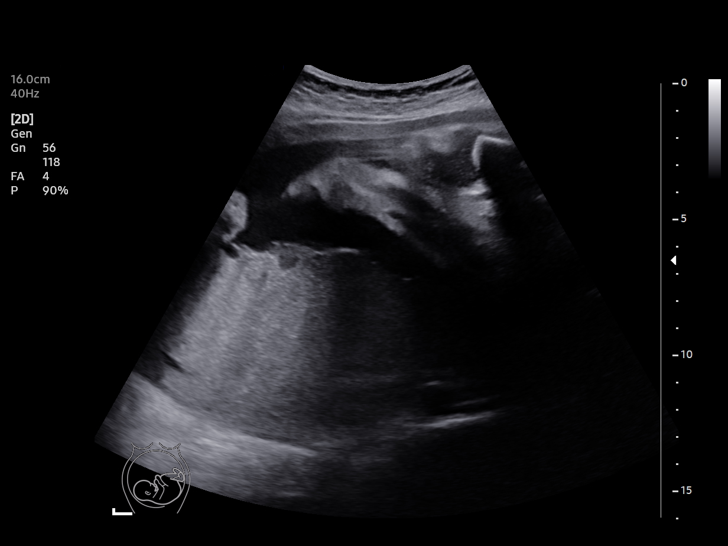
[im 11/14]
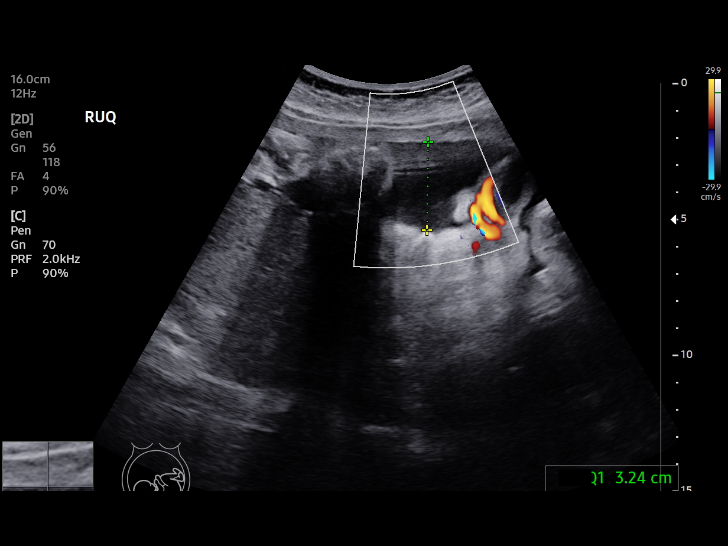
[im 12/14]
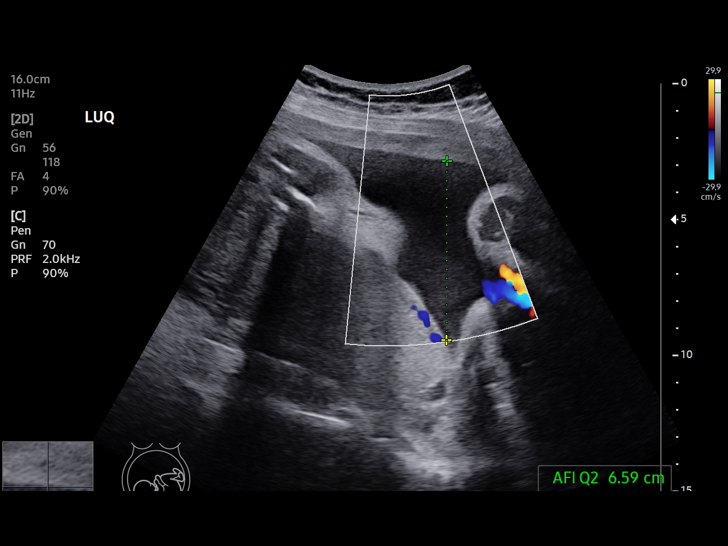
[im 13/14]
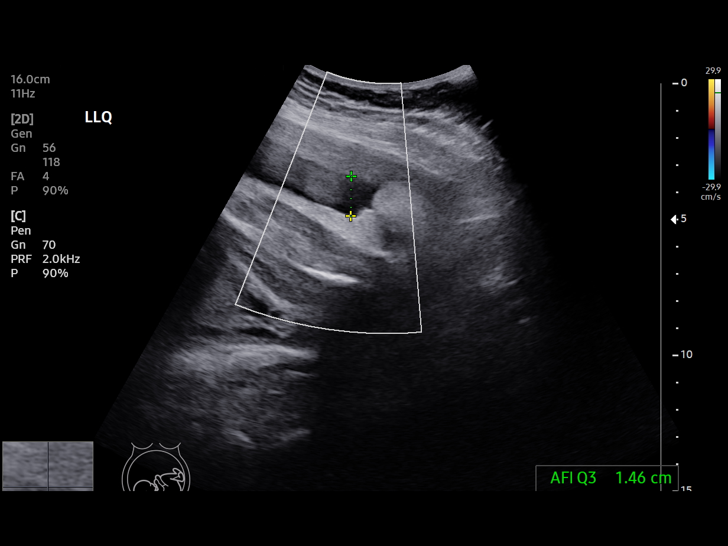
[im 14/14]
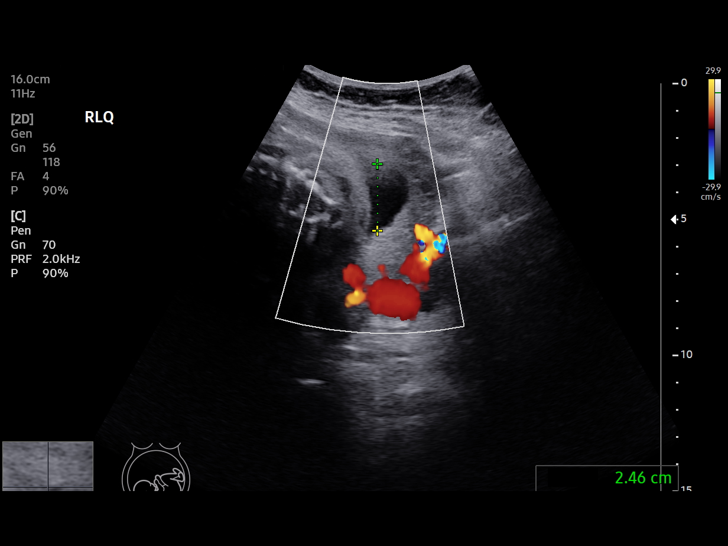

[14 of 14 positions shown; findings below may reference images not displayed]

[REDACTED]care at

Service(s) Provided

Indications

 Weeks of gestation of pregnancy not
 specified
 Hypertension - Gestational
Fetal Evaluation

 Num Of Fetuses:         1
 Preg. Location:         Intrauterine
 Cardiac Activity:       Observed
 Presentation:           Transverse, head to maternal right

 Amniotic Fluid
 AFI FV:      Within normal limits

 AFI Sum(cm)                 Largest Pocket(cm)


 RUQ(cm)       RLQ(cm)       LUQ(cm)        LLQ(cm)

Biophysical Evaluation
 Amniotic F.V:   Pocket => 2 cm             F. Tone:        Observed
 F. Movement:    Observed                   N.S.T:          Reactive
 F. Breathing:   Observed                   Score:          [DATE]
Impression

 Antenatal testing due to gestational hypertension.
 Testing is reassuring, BPP [DATE].
Recommendations

 Continue weekly antenatal testing till delivery .
               Lundquist, Chilo

## 2022-07-09 IMAGING — US US FETAL BPP W/ NON-STRESS
1 series · 13 of 13 positions shown · non-contrast
Comparison: none

[Series 1: us fetal bpp w/ non-stress · 13 acquisitions, 13 frames shown]
[im 1/13]
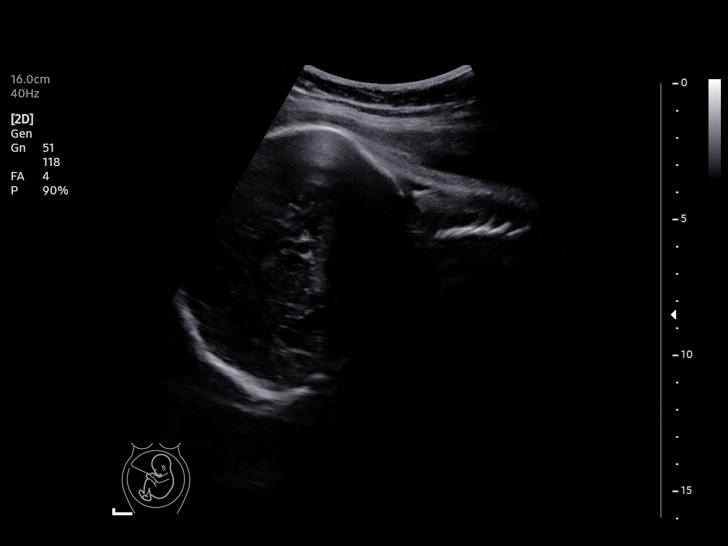
[im 2/13]
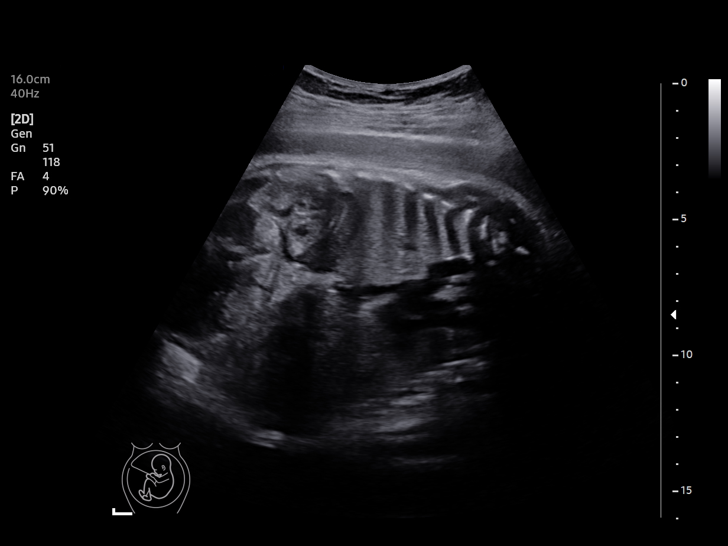
[im 3/13]
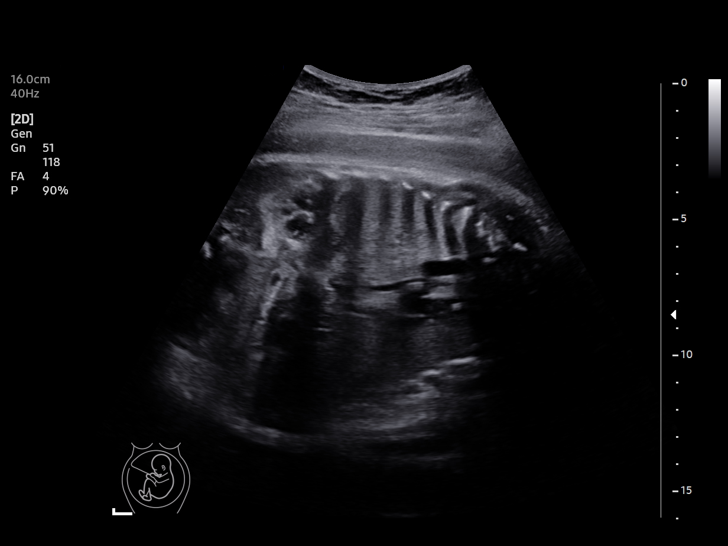
[im 4/13]
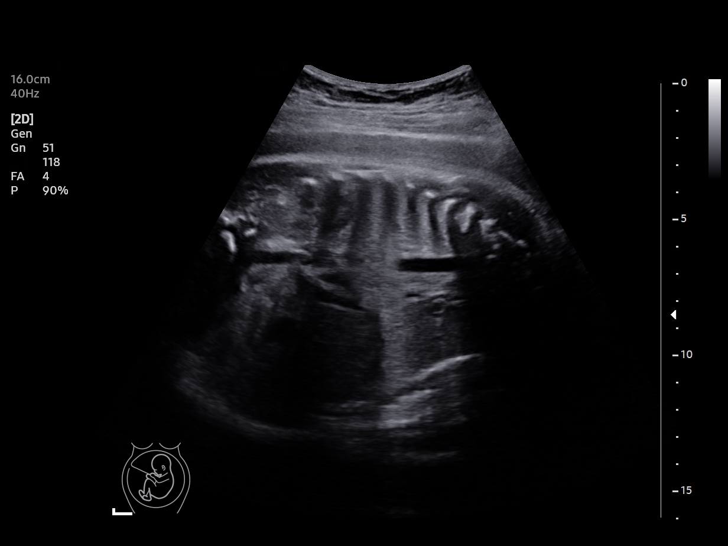
[im 5/13]
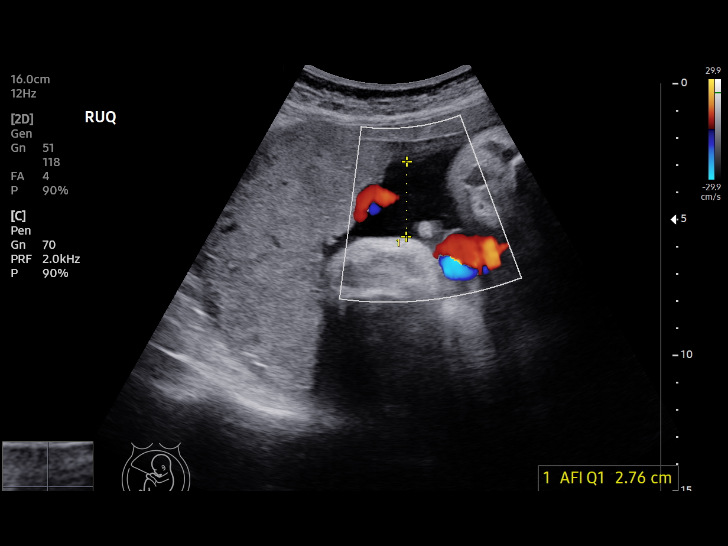
[im 6/13]
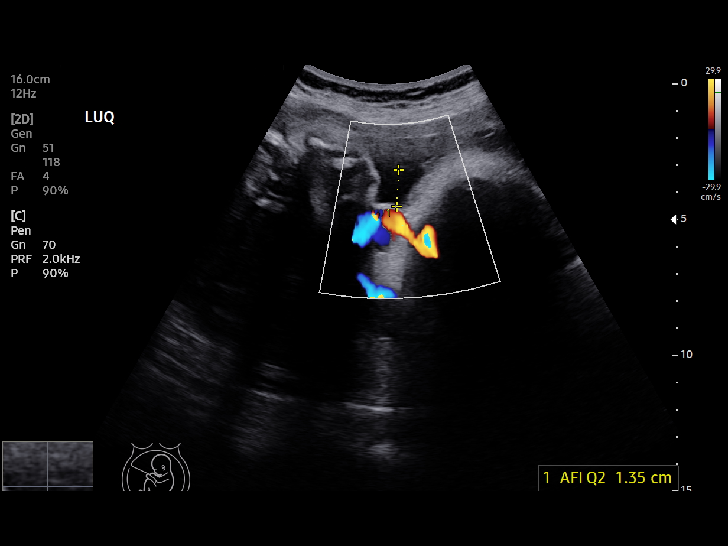
[im 7/13]
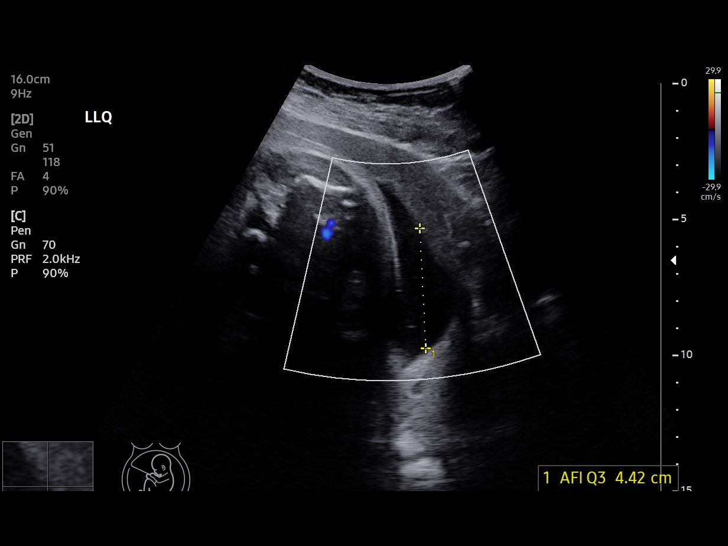
[im 8/13]
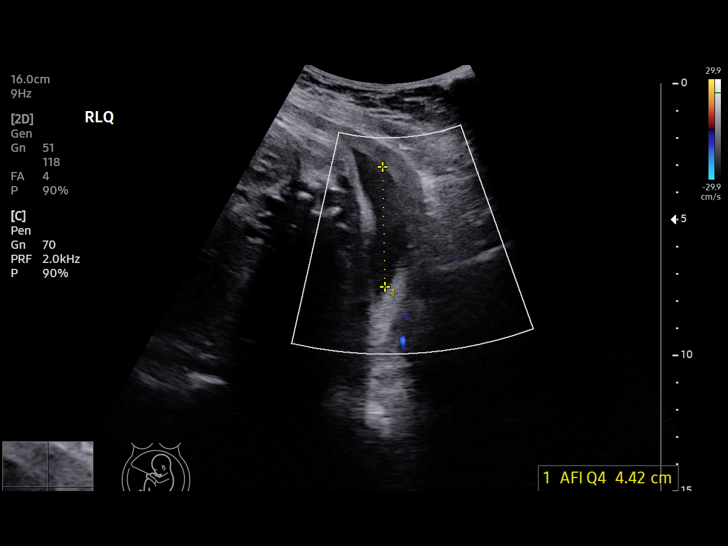
[im 9/13]
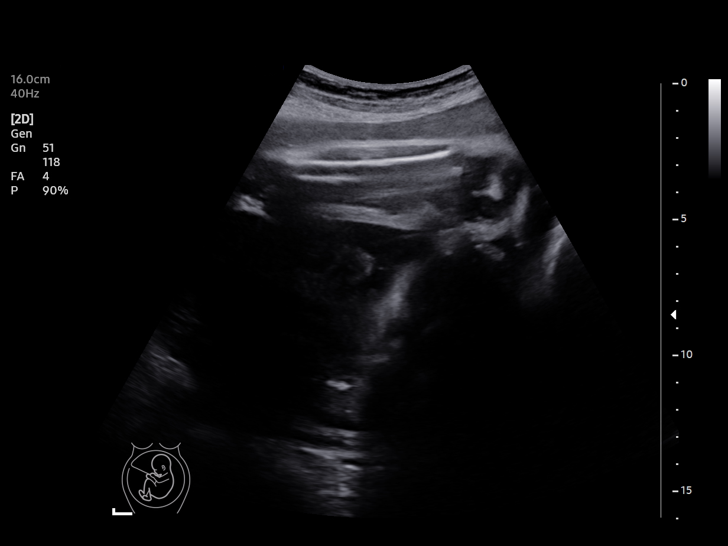
[im 10/13]
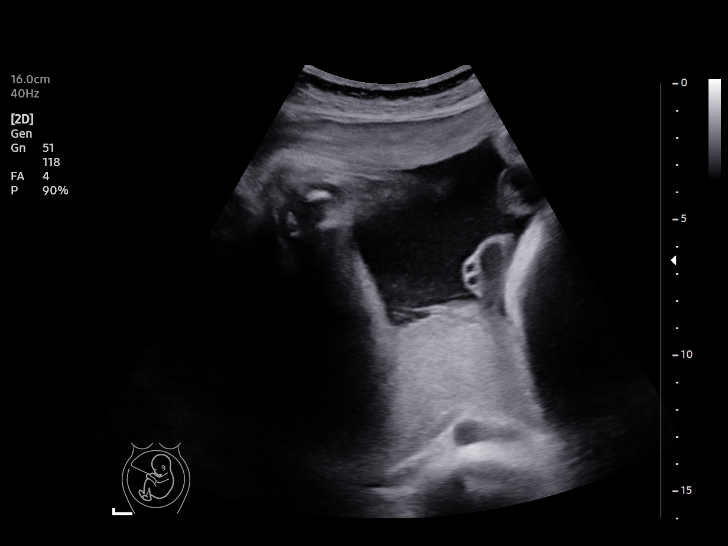
[im 11/13]
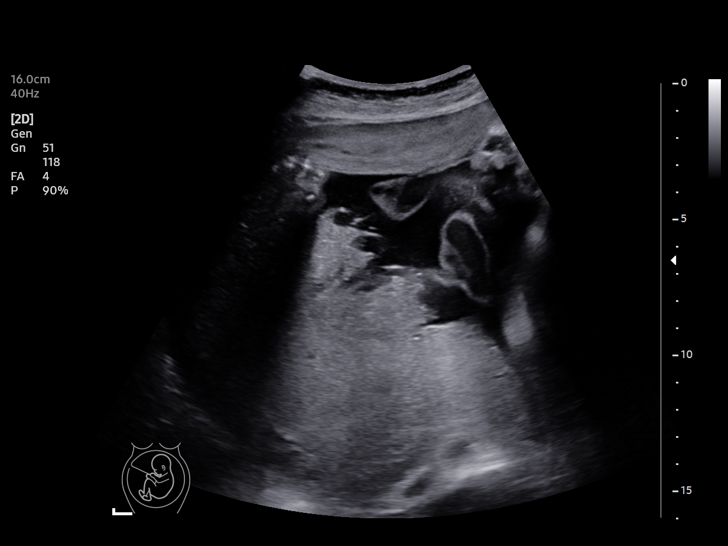
[im 12/13]
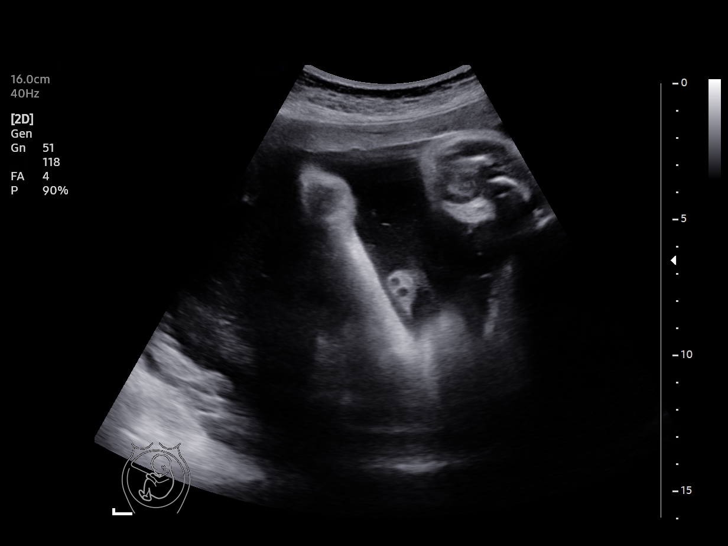
[im 13/13]
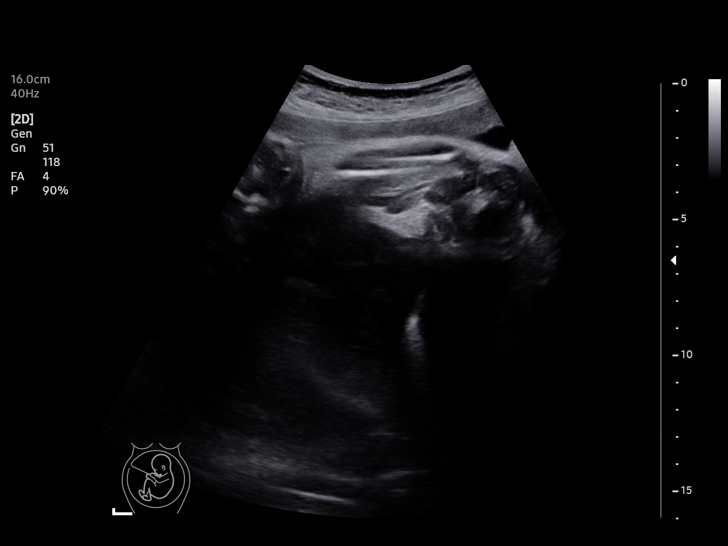

[13 of 13 positions shown; findings below may reference images not displayed]

[REDACTED]care at

Service(s) Provided

Indications

 35 weeks gestation of pregnancy
 Hypertension - Gestational
Fetal Evaluation

 Num Of Fetuses:         1
 Preg. Location:         Intrauterine
 Cardiac Activity:       Observed
 Presentation:           Frank breech

 Amniotic Fluid
 AFI FV:      Within normal limits

 AFI Sum(cm)     %Tile       Largest Pocket(cm)
 12.95           43

 RUQ(cm)       RLQ(cm)       LUQ(cm)        LLQ(cm)

Biophysical Evaluation

 Amniotic F.V:   Pocket => 2 cm             F. Tone:        Observed
 F. Movement:    Observed                   N.S.T:          Reactive
 F. Breathing:   Observed                   Score:          [DATE]
OB History

 Gravidity:    3         Term:   2        Prem:   0        SAB:   0
 TOP:          0       Ectopic:  0        Living: 2
Gestational Age

 LMP:           35w 4d        Date:  04/29/20                 EDD:   02/03/21
 Best:          35w 4d     Det. By:  LMP  (04/29/20)          EDD:   02/03/21
Impression

 Antenatal testing due to gestational hypertension.
 Testing is reassuring, BPP [DATE].
 Fetus noted to be in breech presentation.
Recommendations

 Continue weekly antenatal testing till delivery .
               Tarla, Bambucafe

## 2022-07-22 ENCOUNTER — Emergency Department (HOSPITAL_COMMUNITY): Payer: Self-pay

## 2022-07-22 ENCOUNTER — Other Ambulatory Visit: Payer: Self-pay

## 2022-07-22 ENCOUNTER — Emergency Department (HOSPITAL_COMMUNITY)
Admission: EM | Admit: 2022-07-22 | Discharge: 2022-07-23 | Disposition: A | Discharge status: Home / Self Care | Payer: Self-pay | Attending: Emergency Medicine | Admitting: Emergency Medicine

## 2022-07-22 DIAGNOSIS — D500 Iron deficiency anemia secondary to blood loss (chronic): Secondary | ICD-10-CM | POA: Insufficient documentation

## 2022-07-22 LAB — URINALYSIS, ROUTINE W REFLEX MICROSCOPIC
Bilirubin Urine: NEGATIVE
Glucose, UA: NEGATIVE mg/dL
Hgb urine dipstick: NEGATIVE
Ketones, ur: NEGATIVE mg/dL
Leukocytes,Ua: NEGATIVE
Nitrite: NEGATIVE
Protein, ur: NEGATIVE mg/dL
Specific Gravity, Urine: 1.017 (ref 1.005–1.030)
pH: 6 (ref 5.0–8.0)

## 2022-07-22 LAB — BASIC METABOLIC PANEL
Anion gap: 10 (ref 5–15)
BUN: 18 mg/dL (ref 6–20)
CO2: 23 mmol/L (ref 22–32)
Calcium: 9.1 mg/dL (ref 8.9–10.3)
Chloride: 105 mmol/L (ref 98–111)
Creatinine, Ser: 0.59 mg/dL (ref 0.44–1.00)
GFR, Estimated: >60 mL/min (ref >60)
Glucose, Bld: 85 mg/dL (ref 70–99)
Potassium: 3.3 mmol/L — ABNORMAL LOW (ref 3.5–5.1)
Sodium: 138 mmol/L (ref 135–145)

## 2022-07-22 LAB — CBC WITH DIFFERENTIAL/PLATELET
Abs Immature Granulocytes: 0.01 10*3/uL (ref 0.00–0.07)
Basophils Absolute: 0 10*3/uL (ref 0.0–0.1)
Basophils Relative: 0 %
Eosinophils Absolute: 0 10*3/uL (ref 0.0–0.5)
Eosinophils Relative: 0 %
HCT: 23.7 % — ABNORMAL LOW (ref 36.0–46.0)
Hemoglobin: 6.5 g/dL — CL (ref 12.0–15.0)
Immature Granulocytes: 0 %
Lymphocytes Relative: 30 %
Lymphs Abs: 2.1 10*3/uL (ref 0.7–4.0)
MCH: 16.4 pg — ABNORMAL LOW (ref 26.0–34.0)
MCHC: 27.4 g/dL — ABNORMAL LOW (ref 30.0–36.0)
MCV: 59.7 fL — ABNORMAL LOW (ref 80.0–100.0)
Monocytes Absolute: 0.4 10*3/uL (ref 0.1–1.0)
Monocytes Relative: 6 %
Neutro Abs: 4.6 10*3/uL (ref 1.7–7.7)
Neutrophils Relative %: 64 %
Platelets: 419 10*3/uL — ABNORMAL HIGH (ref 150–400)
RBC: 3.97 MIL/uL (ref 3.87–5.11)
RDW: 19.3 % — ABNORMAL HIGH (ref 11.5–15.5)
WBC: 7.2 10*3/uL (ref 4.0–10.5)
nRBC: 0 % (ref 0.0–0.2)

## 2022-07-22 LAB — PREGNANCY, URINE: Preg Test, Ur: NEGATIVE

## 2022-07-22 LAB — TROPONIN I (HIGH SENSITIVITY): Troponin I (High Sensitivity): <2 ng/L (ref <18)

## 2022-07-22 NOTE — ED Triage Notes (Signed)
Patient received a call from her MD clinic advised her to go to ER for blood transfusion due to abnormal blood tests results taken last Friday , denies bleeding or SOB .

## 2022-07-22 NOTE — ED Notes (Signed)
Triage PA provider notified on patient's abnormal lab result.

## 2022-07-22 NOTE — ED Provider Triage Note (Signed)
Emergency Medicine Provider Triage Evaluation Note  Holly Gutierrez , a 37 y.o. female  was evaluated in triage.  Pt complains of weakness and headache.  Patient states she has been having symptoms for 2 weeks and went to the urgent care 3 days ago in which she was told she had such a low hemoglobin that she needed transfusion however she does not remember what her hemoglobin was.  Patient states sometimes she feels dizzy with no obvious triggers and also endorsed shortness of breath.  Last menstrual period 06/13/2022  Patient denied chest pain, abdominal pain, nausea/vomiting, fever/chills, vision changes, falls, dysuria, vaginal bleeding, hematuria, hematochezia  Review of Systems  Positive: See HPI Negative: See HPI  Physical Exam  BP 137/89   Pulse 74   Temp 98.2 F (36.8 C)   Resp 16   SpO2 100%  Gen:   Awake, no distress   Resp:  Normal effort  MSK:   Moves extremities without difficulty  Other:  Eyes bilaterally PERRL, abdomen nontender to palpation, mucous membranes moist and not pale  Medical Decision Making  Medically screening exam initiated at 7:50 PM.  Appropriate orders placed.  Holly Gutierrez was informed that the remainder of the evaluation will be completed by another provider, this initial triage assessment does not replace that evaluation, and the importance of remaining in the ED until their evaluation is complete.  Workup initiated, patient stable at this time   Holly Gutierrez 07/22/22 1956

## 2022-07-23 LAB — TROPONIN I (HIGH SENSITIVITY): Troponin I (High Sensitivity): <2 ng/L (ref <18)

## 2022-07-23 LAB — PREPARE RBC (CROSSMATCH)

## 2022-07-23 MED ORDER — SODIUM CHLORIDE 0.9% IV SOLUTION: Freq: Once | INTRAVENOUS | Status: AC

## 2022-07-23 MED ORDER — FERROUS SULFATE 325 (65 FE) MG PO TABS: 325.0000 mg | ORAL_TABLET | Freq: Every day | ORAL | 2 refills | Status: AC

## 2022-07-23 NOTE — ED Notes (Signed)
Blood transfusion started. Pt denies chills, nausea, vomiting or irritation at the IV site. Pt resting comfortably at this time.

## 2022-07-23 NOTE — Discharge Instructions (Signed)
Te atendieron hoy por anemia. Es probable que esto est relacionado con perodos abundantes. Empiece a tomar hierro a diario y Theme park manager. Haga un seguimiento con su mdico de atencin primaria u obstetra/gineclogo para Physiological scientist opciones para controlar mejor sus perodos abundantes. Si se siente mareado, tiene palpitaciones o cualquier sntoma nuevo o que [place], debe ser [place].

## 2022-07-23 NOTE — ED Provider Notes (Signed)
[facility] Provider Note   CSN: [id number] Arrival date & time: 07/22/22  1924     History  Chief Complaint  Patient presents with   Abnormal Lab Results    Holly Gutierrez is a 37 y.o. female.  HPI     This is a 37 year old female who presents with concerns for abnormal labs.  Patient reports that she was seen at her primary care visit on Friday.  She was called today regarding significant anemia.  She was referred to the ER for possible blood transfusion.  She has never had a blood transfusion before.  Patient does report that she has quite heavy periods.  She states that the last approximately 4 days.  At times she has to wear diaper to contain the bleeding.  She states that she has occasional headaches and lightheadedness.  Denies shortness of breath or palpitations.  Currently she is asymptomatic.  History taken with Spanish interpreter.  Home Medications Prior to Admission medications   Medication Sig Start Date End Date Taking? Authorizing Provider  ferrous sulfate 325 (65 FE) MG tablet Take 1 tablet (325 mg total) by mouth daily. 07/23/22  Yes Zachrey Deutscher, Barbette Hair, MD      Allergies    Patient has no known allergies.    Review of Systems   Review of Systems  Genitourinary:  Positive for vaginal bleeding.  Neurological:  Positive for light-headedness and headaches.  All other systems reviewed and are negative.   Physical Exam Updated Vital Signs BP 132/87   Pulse 63   Temp 98.1 F (36.7 C) (Oral)   Resp 18   LMP 06/13/2022   SpO2 100%  Physical Exam Vitals and nursing note reviewed.  Constitutional:      Appearance: She is well-developed. She is obese. She is not ill-appearing.  HENT:     Head: Normocephalic and atraumatic.  Eyes:     Pupils: Pupils are equal, round, and reactive to light.  Cardiovascular:     Rate and Rhythm: Normal rate and regular rhythm.     Heart sounds: Normal heart sounds.   Pulmonary:     Effort: Pulmonary effort is normal. No respiratory distress.     Breath sounds: No wheezing.  Abdominal:     Palpations: Abdomen is soft.     Tenderness: There is no abdominal tenderness.  Musculoskeletal:     Cervical back: Neck supple.  Skin:    General: Skin is warm and dry.  Neurological:     Mental Status: She is alert and oriented to person, place, and time.  Psychiatric:        Mood and Affect: Mood normal.     ED Results / Procedures / Treatments   Labs (all labs ordered are listed, but only abnormal results are displayed) Labs Reviewed  BASIC METABOLIC PANEL - Abnormal; Notable for the following components:      Result Value   Potassium 3.3 (*)    All other components within normal limits  CBC WITH DIFFERENTIAL/PLATELET - Abnormal; Notable for the following components:   Hemoglobin 6.5 (*)    HCT 23.7 (*)    MCV 59.7 (*)    MCH 16.4 (*)    MCHC 27.4 (*)    RDW 19.3 (*)    Platelets 419 (*)    All other components within normal limits  PREGNANCY, URINE  URINALYSIS, ROUTINE W REFLEX MICROSCOPIC  TYPE AND SCREEN  PREPARE RBC (CROSSMATCH)  TROPONIN  I (HIGH SENSITIVITY)  TROPONIN I (HIGH SENSITIVITY)    EKG EKG Interpretation  Date/Time:  Monday July 22 2022 19:50:00 EDT Ventricular Rate:  71 PR Interval:  122 QRS Duration: 92 QT Interval:  408 QTC Calculation: 443 R Axis:   74 Text Interpretation: Normal sinus rhythm Normal ECG When compared with ECG of 07-May-2020 15:53, PREVIOUS ECG IS PRESENT Confirmed by Thayer Jew [phone]) on 07/23/2022 12:14:23 AM  Radiology DG Chest 2 View  Result Date: 07/22/2022 CLINICAL DATA:  Shortness of breath EXAM: CHEST - 2 VIEW COMPARISON:  05/07/2020 FINDINGS: The heart size and mediastinal contours are within normal limits. Both lungs are clear. The visualized skeletal structures are unremarkable. IMPRESSION: Normal study. Electronically Signed   By: Rolm Baptise M.D.   On: 07/22/2022 20:29     Procedures .Critical Care  Performed by: Merryl Hacker, MD Authorized by: Merryl Hacker, MD   Critical care provider statement:    Critical care time (minutes):  31   Critical care was necessary to treat or prevent imminent or life-threatening deterioration of the following conditions: symptomatic anemia requiring transfusion.   Critical care was time spent personally by me on the following activities:  Development of treatment plan with patient or surrogate, discussions with consultants, evaluation of patient's response to treatment, examination of patient, ordering and review of laboratory studies, ordering and review of radiographic studies, ordering and performing treatments and interventions, pulse oximetry, re-evaluation of patient's condition and review of old charts     Medications Ordered in ED Medications  0.9 %  sodium chloride infusion (Manually program via Guardrails IV Fluids) (0 mLs Intravenous Stopped 07/23/22 0506)    ED Course/ Medical Decision Making/ [name]                             Medical Decision Making Amount and/or Complexity of Data Reviewed Labs: ordered.  Risk OTC drugs. Prescription drug management.   This patient presents to the ED for concern of anemia, this involves an extensive number of treatment options, and is a complaint that carries with it a high risk of complications and morbidity.  I considered the following differential and admission for this acute, potentially life threatening condition.  The differential diagnosis includes chronic blood loss anemia, acute blood loss, iron deficiency  MDM:    This is a 37 year old female who presents with concern for anemia.  Had outpatient lab work with reportedly low hemoglobin.  Hemoglobin here 6.5.  She reports heavy menstrual bleeding monthly.  She does report some lightheadedness.  She is not significantly tachycardic or hypotensive.  She has no history of heart disease.  We discussed  utility of blood transfusion.  I do feel it would likely make her feel better.  She has consented to transfusion.  She tolerated this well.  Will start on iron supplementation and refer her back to her primary physician and/or OB/GYN to discuss options regarding management of menorrhagia.  She was given strict return precautions.  (Labs, imaging, consults)  Labs: I Ordered, and personally interpreted labs.  The pertinent results include: CBC, BMP, troponin, urinalysis, urine pregnancy  Imaging Studies ordered: I ordered imaging studies including chest x-ray I independently visualized and interpreted imaging. I agree with the radiologist interpretation  Additional history obtained from chart review.  External records from outside source obtained and reviewed including prior evaluations  Cardiac Monitoring: The patient was maintained on a cardiac monitor.  If on  the cardiac monitor, I personally viewed and interpreted the cardiac monitored which showed an underlying rhythm of: Sinus rhythm  Reevaluation: After the interventions noted above, I reevaluated the patient and found that they have :improved  Social Determinants of Health:  lives independently  Disposition: Discharge  Co morbidities that complicate the patient evaluation  Past Medical History:  Diagnosis Date   Medical history non-contributory    Pregnancy induced hypertension      Medicines Meds ordered this encounter  Medications   0.9 %  sodium chloride infusion (Manually program via Guardrails IV Fluids)   ferrous sulfate 325 (65 FE) MG tablet    Sig: Take 1 tablet (325 mg total) by mouth daily.    Dispense:  30 tablet    Refill:  2    I have reviewed the patients home medicines and have made adjustments as needed  Problem List / ED Course: Problem List Items Addressed This Visit   None Visit Diagnoses     Iron deficiency anemia due to chronic blood loss    -  Primary   Relevant Medications   ferrous  sulfate 325 (65 FE) MG tablet                   Final Clinical Impression(s) / ED Diagnoses Final diagnoses:  Iron deficiency anemia due to chronic blood loss    Rx / DC Orders ED Discharge Orders          Ordered    ferrous sulfate 325 (65 FE) MG tablet  Daily        07/23/22 0526              Merryl Hacker, MD 07/23/22 [phone]

## 2022-07-24 LAB — BPAM RBC
Blood Product Expiration Date: 202404132359
ISSUE DATE / TIME: 202403190233
Unit Type and Rh: 5100

## 2022-07-24 LAB — TYPE AND SCREEN
ABO/RH(D): O POS
Antibody Screen: NEGATIVE
Unit division: 0
